# Patient Record
Sex: Female | Born: 1985 | Race: Black or African American | Hispanic: No | Marital: Single | Smoking: Current every day smoker
Health system: Southern US, Community
[De-identification: ages and names within clinical notes are randomized; demographics above are authoritative.]

## PROBLEM LIST (undated history)

## (undated) DIAGNOSIS — Z789 Other specified health status: Secondary | ICD-10-CM

## (undated) DIAGNOSIS — E282 Polycystic ovarian syndrome: Secondary | ICD-10-CM

## (undated) DIAGNOSIS — F32A Depression, unspecified: Secondary | ICD-10-CM

## (undated) HISTORY — DX: Depression, unspecified: F32.A

## (undated) HISTORY — DX: Polycystic ovarian syndrome: E28.2

## (undated) HISTORY — PX: OTHER SURGICAL HISTORY: SHX169

---

## 2005-03-31 ENCOUNTER — Emergency Department (HOSPITAL_COMMUNITY): Admission: EM | Admit: 2005-03-31 | Discharge: 2005-03-31 | Payer: Self-pay | Admitting: Emergency Medicine

## 2009-09-03 ENCOUNTER — Emergency Department (HOSPITAL_COMMUNITY): Admission: EM | Admit: 2009-09-03 | Discharge: 2009-09-03 | Payer: Self-pay | Admitting: Emergency Medicine

## 2010-07-03 ENCOUNTER — Emergency Department (HOSPITAL_COMMUNITY)
Admission: EM | Admit: 2010-07-03 | Discharge: 2010-07-03 | Disposition: A | Discharge status: Home / Self Care | Payer: Self-pay | Attending: Emergency Medicine | Admitting: Emergency Medicine

## 2010-07-03 ENCOUNTER — Emergency Department (HOSPITAL_COMMUNITY): Payer: Self-pay

## 2010-07-03 DIAGNOSIS — M25519 Pain in unspecified shoulder: Secondary | ICD-10-CM | POA: Insufficient documentation

## 2013-03-17 HISTORY — PX: SURGICAL HX OTHER: 99

## 2013-10-24 ENCOUNTER — Encounter (HOSPITAL_COMMUNITY): Payer: Self-pay | Admitting: Emergency Medicine

## 2013-10-24 ENCOUNTER — Emergency Department (HOSPITAL_COMMUNITY): Payer: Medicaid Other

## 2013-10-24 ENCOUNTER — Emergency Department (HOSPITAL_COMMUNITY)
Admission: EM | Admit: 2013-10-24 | Discharge: 2013-10-24 | Disposition: A | Discharge status: Home / Self Care | Payer: Medicaid Other | Attending: Emergency Medicine | Admitting: Emergency Medicine

## 2013-10-24 DIAGNOSIS — M25519 Pain in unspecified shoulder: Secondary | ICD-10-CM | POA: Diagnosis present

## 2013-10-24 DIAGNOSIS — Z87891 Personal history of nicotine dependence: Secondary | ICD-10-CM | POA: Insufficient documentation

## 2013-10-24 DIAGNOSIS — M25511 Pain in right shoulder: Secondary | ICD-10-CM

## 2013-10-24 DIAGNOSIS — R52 Pain, unspecified: Secondary | ICD-10-CM | POA: Insufficient documentation

## 2013-10-24 MED ORDER — CYCLOBENZAPRINE HCL 5 MG PO TABS: 5.0000 mg | ORAL_TABLET | Freq: Three times a day (TID) | ORAL | Status: DC | PRN

## 2013-10-24 MED ORDER — NAPROXEN 500 MG PO TABS: 500.0000 mg | ORAL_TABLET | Freq: Two times a day (BID) | ORAL | Status: DC

## 2013-10-24 MED ORDER — TRAMADOL HCL 50 MG PO TABS: 50.0000 mg | ORAL_TABLET | Freq: Four times a day (QID) | ORAL | Status: DC | PRN

## 2013-10-24 NOTE — ED Notes (Signed)
Pain rt shoulder, onset this am.  No known injury, Tender to palp and pain with motion, Good radial pulse and distal sensation.

## 2013-10-24 NOTE — ED Provider Notes (Signed)
CSN: [id number]     Arrival date & time 10/24/13  1639 History  This chart was scribed for a non-physician practitioner, Debroah Baller, FNP, working with Fredia Sorrow, MD by Cathie Hoops, ED Scribe. The patient was seen in [facility]. The patient's care was started at 5:48 PM.  Chief Complaint  Patient presents with  . Shoulder Pain   Patient is a 28 y.o. female presenting with shoulder pain. The history is provided by the patient. No language interpreter was used.  Shoulder Pain This is a new problem. The current episode started 6 to 12 hours ago. The problem has been gradually worsening. Pertinent negatives include no shortness of breath. She has tried acetaminophen for the symptoms. The treatment provided no relief.   HPI Comments: Jennifer Pollard is a 28 y.o. female who presents to the Emergency Department complaining of new, moderate, gradually worsening right shoulder pain onset 8 hours ago. Pt states she woke up with the pain in the posterior aspect of the right shoulder. Pt attributes her pain to carrying her 60 month old son who weighs 22 lbs. Pt denies any other injury. Pt reports her pain is worsened with right arm movement. Pt reports she carries her son on her right hip. Pt reports she is right-handed.  Pt reports she has taken tylenol with minimal relief. Pt denies fever, chills, nausea or vomiting.  History reviewed. No pertinent past medical history. History reviewed. No pertinent past surgical history. History reviewed. No pertinent family history. History  Substance Use Topics  . Smoking status: Former Research scientist (life sciences)  . Smokeless tobacco: Not on file  . Alcohol Use: No   OB History   Grav Para Term Preterm Abortions TAB SAB Ect Mult Living                 Review of Systems  Constitutional: Negative for fever and chills.  Respiratory: Negative for shortness of breath.   Gastrointestinal: Negative for nausea and vomiting.  Musculoskeletal: Positive for arthralgias (right  shoulder).  Neurological: Negative for weakness.   Allergies  Review of patient's allergies indicates no known allergies.  Home Medications   Prior to Admission medications   Medication Sig Start Date End Date Taking? Authorizing Provider  cyclobenzaprine (FLEXERIL) 5 MG tablet Take 1 tablet (5 mg total) by mouth 3 (three) times daily as needed for muscle spasms. 10/24/13   Hope Bunnie Pion, NP  naproxen (NAPROSYN) 500 MG tablet Take 1 tablet (500 mg total) by mouth 2 (two) times daily. 10/24/13   Hope Bunnie Pion, NP  traMADol (ULTRAM) 50 MG tablet Take 1 tablet (50 mg total) by mouth every 6 (six) hours as needed. 10/24/13   Hope Bunnie Pion, NP   Triage Vitals: BP 126/83  Pulse 86  Temp(Src) 98.8 F (37.1 C) (Oral)  Resp 16  Ht 5\' 4"  (1.626 m)  Wt 150 lb (68.04 kg)  BMI 25.73 kg/m2  SpO2 100% Physical Exam  Nursing note and vitals reviewed. Constitutional: She is oriented to person, place, and time. She appears well-developed and well-nourished.  HENT:  Head: Normocephalic.  Eyes: EOM are normal.  Neck: Neck supple.  Cardiovascular: Normal rate.   Pulmonary/Chest: Effort normal.  Musculoskeletal: Normal range of motion.  Right Shoulder: Pain with ROM. Normal reflexes. Strong radial pulses, good grips and equal.   Neurological: She is alert and oriented to person, place, and time. No cranial nerve deficit.  Skin: Skin is warm and dry.  Psychiatric: She has a normal mood and  affect. Her behavior is normal.    ED Course  Procedures (including critical care time) DIAGNOSTIC STUDIES: Oxygen Saturation is 100% on RA, normal by my interpretation.    COORDINATION OF CARE: 5:54 PM- Patient informed of current plan for treatment and evaluation and agrees with plan at this time.  Imaging Review  Dg Shoulder Right 10/24/2013   CLINICAL DATA:  28 year old female with right shoulder pain. No known injury.  EXAM: RIGHT SHOULDER - 2+ VIEW  COMPARISON:  None.  FINDINGS: There is no evidence of  fracture or dislocation.  Slight low position of the humeral head may represent a joint effusion.  No focal bony abnormalities are noted.  The visualized right bony thorax is unremarkable.  IMPRESSION: Question shoulder effusion.  No bony abnormalities.   Electronically Signed   By: Hassan Rowan M.D.   On: 10/24/2013 17:06    MDM  28 y.o. female with right shoulder pain without known injury. Stable for discharge without neurovascular deficits. Arm sling, ice, rest and follow up with ortho. Discussed with the patient and all questioned fully answered. She will return if any problems arise.    Medication List         cyclobenzaprine 5 MG tablet  Commonly known as:  FLEXERIL  Take 1 tablet (5 mg total) by mouth 3 (three) times daily as needed for muscle spasms.     naproxen 500 MG tablet  Commonly known as:  NAPROSYN  Take 1 tablet (500 mg total) by mouth 2 (two) times daily.     traMADol 50 MG tablet  Commonly known as:  ULTRAM  Take 1 tablet (50 mg total) by mouth every 6 (six) hours as needed.         I personally performed the services described in this documentation, which was scribed in my presence. The recorded information has been reviewed and is accurate.     [place], [place] 10/27/13 [phone]

## 2013-10-24 NOTE — ED Notes (Signed)
Pt woke up with right shoulder pain, denies any pain, limited ROM

## 2013-10-31 NOTE — ED Provider Notes (Signed)
Medical screening examination/treatment/procedure(s) were performed by non-physician practitioner and as supervising physician I was immediately available for consultation/collaboration.   EKG Interpretation None       Fredia Sorrow, MD 10/31/13 [phone]

## 2014-08-29 ENCOUNTER — Emergency Department (HOSPITAL_COMMUNITY)
Admission: EM | Admit: 2014-08-29 | Discharge: 2014-08-29 | Disposition: A | Discharge status: Home / Self Care | Payer: Medicaid Other | Attending: Emergency Medicine | Admitting: Emergency Medicine

## 2014-08-29 ENCOUNTER — Emergency Department (HOSPITAL_COMMUNITY): Payer: Medicaid Other

## 2014-08-29 ENCOUNTER — Encounter (HOSPITAL_COMMUNITY): Payer: Self-pay | Admitting: Emergency Medicine

## 2014-08-29 DIAGNOSIS — Z720 Tobacco use: Secondary | ICD-10-CM | POA: Insufficient documentation

## 2014-08-29 DIAGNOSIS — Z3202 Encounter for pregnancy test, result negative: Secondary | ICD-10-CM | POA: Diagnosis not present

## 2014-08-29 DIAGNOSIS — M79672 Pain in left foot: Secondary | ICD-10-CM

## 2014-08-29 DIAGNOSIS — M79671 Pain in right foot: Secondary | ICD-10-CM | POA: Diagnosis not present

## 2014-08-29 LAB — POC URINE PREG, ED: PREG TEST UR: NEGATIVE

## 2014-08-29 MED ORDER — IBUPROFEN 800 MG PO TABS: 800.0000 mg | ORAL_TABLET | Freq: Three times a day (TID) | ORAL | Status: DC

## 2014-08-29 MED ORDER — TRAMADOL HCL 50 MG PO TABS: ORAL_TABLET | ORAL | Status: AC

## 2014-08-29 MED ORDER — IBUPROFEN 800 MG PO TABS
800.0000 mg | ORAL_TABLET | Freq: Once | ORAL | Status: AC
  Administered 2014-08-29: 800 mg via ORAL
  Filled 2014-08-29: qty 1

## 2014-08-29 MED ORDER — KETOROLAC TROMETHAMINE 10 MG PO TABS: 10.0000 mg | ORAL_TABLET | Freq: Three times a day (TID) | ORAL | Status: DC

## 2014-08-29 MED ORDER — ACETAMINOPHEN 325 MG PO TABS
650.0000 mg | ORAL_TABLET | Freq: Once | ORAL | Status: AC
  Administered 2014-08-29: 650 mg via ORAL
  Filled 2014-08-29: qty 2

## 2014-08-29 NOTE — ED Notes (Signed)
Urine pregnancy negative  

## 2014-08-29 NOTE — ED Notes (Signed)
Pt reports right foot pain since yesterday. Pt denies any known injury. nad noted.

## 2014-08-29 NOTE — Discharge Instructions (Signed)
Your xray is negative for acute problem. Please elevate your foot and use the post operative shoe for the next 5 days. Use ibuprofen three times daily with food. Use ultram for pain if needed. Use this medication with caution, it may cause drowsiness. Please see Dr. Aline Brochure for additional evaluation of not improving.

## 2014-08-29 NOTE — ED Provider Notes (Signed)
CSN: [id number]     Arrival date & time 08/29/14  1504 History   First MD Initiated Contact with Patient 08/29/14 1549     Chief Complaint  Patient presents with  . Foot Pain     (Consider location/radiation/quality/duration/timing/severity/associated sxs/prior Treatment) Patient is a 29 y.o. female presenting with lower extremity pain. The history is provided by the patient.  Foot Pain This is a new problem. The current episode started yesterday. The problem occurs intermittently. The problem has been gradually worsening. Associated symptoms include arthralgias. Pertinent negatives include no abdominal pain, chest pain, coughing or neck pain. The symptoms are aggravated by standing and walking. She has tried nothing for the symptoms. The treatment provided moderate relief.    History reviewed. No pertinent past medical history. History reviewed. No pertinent past surgical history. History reviewed. No pertinent family history. History  Substance Use Topics  . Smoking status: Current Every Day Smoker -- 0.50 packs/day  . Smokeless tobacco: Not on file  . Alcohol Use: No   OB History    No data available     Review of Systems  Constitutional: Negative for activity change.       All ROS Neg except as noted in HPI  HENT: Negative for nosebleeds.   Eyes: Negative for photophobia and discharge.  Respiratory: Negative for cough, shortness of breath and wheezing.   Cardiovascular: Negative for chest pain and palpitations.  Gastrointestinal: Negative for abdominal pain and blood in stool.  Genitourinary: Negative for dysuria, frequency and hematuria.  Musculoskeletal: Positive for arthralgias. Negative for back pain and neck pain.  Skin: Negative.   Neurological: Negative for dizziness, seizures and speech difficulty.  Psychiatric/Behavioral: Negative for hallucinations and confusion.      Allergies  Review of patient's allergies indicates no known allergies.  Home Medications    Prior to Admission medications   Medication Sig Start Date End Date Taking? Authorizing Provider  acetaminophen (TYLENOL) 500 MG tablet Take 1,000 mg by mouth every 6 (six) hours as needed for moderate pain.   Yes Historical Provider, MD   BP 122/83 mmHg  Pulse 87  Temp(Src) 98.2 F (36.8 C) (Oral)  Resp 14  Ht 5\' 4"  (1.626 m)  Wt 160 lb (72.576 kg)  BMI 27.45 kg/m2  SpO2 97%  LMP 08/29/2014 Physical Exam  Constitutional: She is oriented to person, place, and time. She appears well-developed and well-nourished.  Non-toxic appearance.  HENT:  Head: Normocephalic.  Right Ear: Tympanic membrane and external ear normal.  Left Ear: Tympanic membrane and external ear normal.  Eyes: EOM and lids are normal. Pupils are equal, round, and reactive to light.  Neck: Normal range of motion. Neck supple. Carotid bruit is not present.  Cardiovascular: Normal rate, regular rhythm, normal heart sounds, intact distal pulses and normal pulses.   Pulmonary/Chest: Breath sounds normal. No respiratory distress.  Abdominal: Soft. Bowel sounds are normal. There is no tenderness. There is no guarding.  Musculoskeletal: Normal range of motion.       Right foot: There is tenderness. There is normal capillary refill and no deformity.       Feet:  Lymphadenopathy:       Head (right side): No submandibular adenopathy present.       Head (left side): No submandibular adenopathy present.    She has no cervical adenopathy.  Neurological: She is alert and oriented to person, place, and time. She has normal strength. No cranial nerve deficit or sensory deficit.  Skin: Skin is  warm and dry.  Psychiatric: She has a normal mood and affect. Her speech is normal.  Nursing note and vitals reviewed.   ED Course  Procedures (including critical care time) Labs Review Labs Reviewed  POC URINE PREG, ED    Imaging Review No results found.   EKG Interpretation None      MDM  Vital signs stable. Neg  Homan's signs noted. No evidence of infection involving the right foot or ankle. Xray is negative for fx or dislocation, or fb. Suspect tendonitis or related problem. Pt fitted with ace bandage and post op shoe and crutches. Pt to follow up with Dr Aline Brochure if not improving on ibuprofen and tramadol.   Final diagnoses:  None    **I have reviewed nursing notes, vital signs, and all appropriate lab and imaging results for this patient.*    Lily Kocher, PA-C 08/29/14 2053  [place], DO 08/29/14 2118

## 2015-07-25 ENCOUNTER — Encounter (HOSPITAL_COMMUNITY): Payer: Self-pay | Admitting: Emergency Medicine

## 2015-07-25 ENCOUNTER — Emergency Department (HOSPITAL_COMMUNITY)
Admission: EM | Admit: 2015-07-25 | Discharge: 2015-07-25 | Disposition: A | Discharge status: Home / Self Care | Payer: Medicaid Other | Attending: Emergency Medicine | Admitting: Emergency Medicine

## 2015-07-25 DIAGNOSIS — F17200 Nicotine dependence, unspecified, uncomplicated: Secondary | ICD-10-CM | POA: Insufficient documentation

## 2015-07-25 DIAGNOSIS — K0889 Other specified disorders of teeth and supporting structures: Secondary | ICD-10-CM | POA: Diagnosis present

## 2015-07-25 MED ORDER — AMOXICILLIN 250 MG PO CAPS
500.0000 mg | ORAL_CAPSULE | Freq: Once | ORAL | Status: AC
  Administered 2015-07-25: 500 mg via ORAL
  Filled 2015-07-25: qty 2

## 2015-07-25 MED ORDER — DICLOFENAC SODIUM 50 MG PO TBEC: 50.0000 mg | DELAYED_RELEASE_TABLET | Freq: Two times a day (BID) | ORAL | Status: AC

## 2015-07-25 MED ORDER — AMOXICILLIN 500 MG PO CAPS: 500.0000 mg | ORAL_CAPSULE | Freq: Three times a day (TID) | ORAL | Status: DC

## 2015-07-25 NOTE — ED Notes (Signed)
Onset yesterday, lower pain on left, pt feels like she has a wisdom tooth coming in.

## 2015-07-25 NOTE — Discharge Instructions (Signed)

## 2015-07-25 NOTE — ED Provider Notes (Signed)
CSN: [id number]     Arrival date & time 07/25/15  2144 History  By signing my name below, I, Randa Evens, attest that this documentation has been prepared under the direction and in the presence of Fransico Meadow, PA-C. Electronically Signed: Randa Evens, ED Scribe. 07/25/2015. 10:28 PM.      Chief Complaint  Patient presents with  . Dental Pain   The history is provided by the patient. No language interpreter was used.   HPI Comments: KELE HRABOVSKY is a 30 y.o. female who presents to the Emergency Department complaining of left sided dental pain onset 1 day prior. Pt states that one of her molars recently cracked. Pt deosnt report any medications PTA. Pt denies fever, nausea, vomiting or trouble swallowing.   History reviewed. No pertinent past medical history. History reviewed. No pertinent past surgical history. No family history on file. Social History  Substance Use Topics  . Smoking status: Current Some Day Smoker -- 0.50 packs/day  . Smokeless tobacco: None  . Alcohol Use: No   OB History    No data available      Review of Systems  Constitutional: Negative for fever and chills.  HENT: Positive for dental problem. Negative for trouble swallowing.   Gastrointestinal: Negative for nausea and vomiting.  All other systems reviewed and are negative.    Allergies  Review of patient's allergies indicates no known allergies.  Home Medications   Prior to Admission medications   Medication Sig Start Date End Date Taking? Authorizing Provider  acetaminophen (TYLENOL) 500 MG tablet Take 1,000 mg by mouth every 6 (six) hours as needed for moderate pain.    Historical Provider, MD  ibuprofen (ADVIL,MOTRIN) 800 MG tablet Take 1 tablet (800 mg total) by mouth 3 (three) times daily. 08/29/14   Lily Kocher, PA-C  ibuprofen (ADVIL,MOTRIN) 800 MG tablet Take 1 tablet (800 mg total) by mouth 3 (three) times daily. 08/29/14   Lily Kocher, PA-C  traMADol (ULTRAM) 50 MG tablet  1 or 2 po q6h prn pain 08/29/14   Lily Kocher, PA-C   BP 129/89 mmHg  Pulse 80  Temp(Src) 98.8 F (37.1 C) (Oral)  Resp 18  Ht 5\' 4"  (1.626 m)  Wt 145 lb (65.772 kg)  BMI 24.88 kg/m2  SpO2 100%  LMP 07/24/2015   Physical Exam  Constitutional: She is oriented to person, place, and time. She appears well-developed and well-nourished. No distress.  HENT:  Head: Normocephalic and atraumatic.  Brokne left 3rd molar.   Eyes: Conjunctivae and EOM are normal.  Neck: Neck supple. No tracheal deviation present.  Cardiovascular: Normal rate.   Pulmonary/Chest: Effort normal. No respiratory distress.  Musculoskeletal: Normal range of motion.  Neurological: She is alert and oriented to person, place, and time.  Skin: Skin is warm and dry.  Psychiatric: She has a normal mood and affect. Her behavior is normal.  Nursing note and vitals reviewed.   ED Course  Procedures (including critical care time)\ DIAGNOSTIC STUDIES: Oxygen Saturation is 100% on RA, normal by my interpretation.    COORDINATION OF CARE: 10:27 PM-Discussed treatment plan with pt at bedside and pt agreed to plan.     Labs Review Labs Reviewed - No data to display  Imaging Review No results found.    EKG Interpretation None      MDM Pt has been seen at dental clinic.  Pt advise to see dentist for evaluation.   Final diagnoses:  Toothache    Meds ordered  this encounter  Medications  . amoxicillin (AMOXIL) 500 MG capsule    Sig: Take 1 capsule (500 mg total) by mouth 3 (three) times daily.    Dispense:  30 capsule    Refill:  0    Order Specific Question:  Supervising Provider    Answer:  MILLER, BRIAN [[id number]]  . diclofenac (VOLTAREN) 50 MG EC tablet    Sig: Take 1 tablet (50 mg total) by mouth 2 (two) times daily.    Dispense:  20 tablet    Refill:  0    Order Specific Question:  Supervising Provider    Answer:  Noemi Chapel [[id number]]  An After Visit Summary was printed and given to the  patient.     [place], PA-C 07/25/15 2231

## 2015-10-03 NOTE — ED Provider Notes (Signed)
Medical screening examination/treatment/procedure(s) were performed by non-physician practitioner and as supervising physician I was immediately available for consultation/collaboration.   EKG Interpretation None       Isla Pence, MD 10/03/15 1559

## 2015-11-29 ENCOUNTER — Encounter (HOSPITAL_BASED_OUTPATIENT_CLINIC_OR_DEPARTMENT_OTHER): Payer: Self-pay | Admitting: Neurology

## 2015-11-29 NOTE — Progress Notes (Signed)
NEUROLOGY HEADACHE CLINIC NOTE       ASSESSMENT AND PLAN (Please see below for documentation of history and exam)  Erica [name]  is a 30 year old female with diagnosis of (G43.701) Chronic migraine without aura with status migrainosus, not intractable  (primary encounter diagnosis).  Currently has near-daily mild headache, with migraine about once a week, has had up to 3 days or more per week of migraine in the past.  Just started Botox prior to her move to [facility], and seeing some improvement with that, appropriate to continue, depending on her insurance plan may need to be done at [facility].  She is doing a great job with lifestyle management including exercise and diet.  May be treating her migraines a little too late for sumatriptan to be optimally effective.  Recommendations discussed as follows:    Try taking 1/2 tab sumatriptan a little earlier in course of migraine    Consider Cefaly device - www.cefaly.[place]    Magnesium Citrate or Glycinate 250 mg daily, slowly increase up to 500 mg twice daily. Side effect may include diarrhea, so we recommend stopping at whatever dose is comfortable for your body without causing this side effect.    Feverfew Tea 1-3 cups daily for both migraine prevention and/or acute treatment. We recommend the tea over the capsule as the capsule variety may not have the active ingredient. This is a natural inflammatory herb. Can get tea bags from [employer] (Buddha Teas, feverfew with lemongrass, more tolerable flavor) or loose leaf from [employer] (the natural pharmacy at [facility]).    Contact me when you know if you will be doing Botox with [place] or with Pascal Lux written for all prescriptions since she is establishing care in [place] area.  Follow-up to be determined, depending on needs and insurance plan.      --------------------------------------------------------------------------------------    CHIEF COMPLAINT   Uncontrolled headache    Referring physician: Self  Referred    Primary care provider: No primary care provider on file.      HISTORY OF PRESENT ILLNESS:  Tanyais a very pleasant60year oldFemaleself-referredfor consultation and evaluation of difficult to treat headache. First headache(s) occured whenTanyawas72years old.  Headaches started to become a problem about20yearsago.Tanyais having an average of20days of headache per month, with around5days of severe headache per month. The average pain severity of these headaches is7out of 10 with the most severe headaches rated as9out of 10. The headaches will often last8hours, occurringmorning.  Tanyabelieves that the headache(s) is caused by "Genetics and stress."Tanyahas the following description of their headache: "I have had migraines since I was 6 and they have gotten worse in the past few years.".    Started age 10, more severe over time.  Used to take Excedrin PRN until about 4-5 years ago, started seeing neurologist and trying preventive meds.  Currently having migraine about once a week, with mild pain in same area of her head most days, not 24 hours a day but most of the day, usually able to ignore it, feels this is mostly stress-related.     GOAL(S) OF THE VISIT IN THE NEUROLOGY HEADACHE CLINIC.  Tanyahas the following goals for this visit:   Discuss headache management   Tanyawould like to ask a specific treatment   TREATMENT PREFERENCE(S).  Tanyaprefers the following treatment options:   Acute prescription medication   BOTOX   Biofeedback or meditation   Hypnosis   Stress management   Tanyais interested in learning about new  medications and other new treatments.   TREATMENT MODALITIES.  Tanyais willing to participate in the following treatment modalities:   Change current medication(s)   Exercise   Change diet   CONCERNS REGARDING CHANGES IN HEADACHE AND RELATED SYMPTOMS.  Tanyais concerned that the headache have changed in the following qualities:   The headaches have  increased in their severity   The headaches are more intense   HEADACHE LOCATION:   Forehead   Back of the head/ occipital area(s)   Neck   LATERALITY OF HEADACHE:   Right side   QUALITY OF HEADACHE PAIN   Pulsating/throbbing   Pressure   HEADACHE TRIGGERS.  Tanyahas the following headache triggers:   Stress - biggest trigger   Menstrual period - now has Mirena IUD   Change in sleep   Skipped meals, thirst or dehydration   Alcohol (for example, red wine)   Exercise and physical exertion - hot weather   Change in weather   Environment over stimulation: glare, odors and other      ASSOCIATED HEADACHE SYMPTOMS.   Nausea/vomiting - nausea typical, vomiting rarely   Light sensitivity   Sound sensitivity   Fatigue/ low energy   Neck stiffness or tenderness   AUTONOMIC SYMPTOMS   No autonomic symptoms during headache   EXACERBATION OF HEADACHES.  Tanyafeels that the headaches are worse with:   Bright light or glare   Loud sounds   Strong smells   Physical exertion   Mental exertion   Being at work   [employer] work      HEADACHE RELIEF.  Tanyafinds the following to be helpful in relieving headaches:   Medications   Avoiding dehydration   Rest   Avoiding bright lights, loud sounds, certain smells   Staying at home and not working        MEDICATIONS TRIED   ACUTE MEDICATIONS   Acetaminophen (tylenol)   Ibuprofen (Motrin/Advil) - occasional for a mild HA   Naproxen (Aleve/Naprosyn)   Ketorolac (Toradol/Sprix) - has [facility] shots at [facility]   Excedrin   Imitrex (Sumatriptan) - takes together with naproxen, about once a week now, effective for pain but still has migraine hangover lasting about 24 hours with a lot of fatigue, depression   Maxalt (Rizatriptan) - tried a long time ago, had a hard time deciding when to take it   Marijuana - PRN for migraine   PREVENTIVE MEDICATIONS FOR HEADACHE   Inderal (propranolol) - not helpful   Topamax (topiramate) - took for 2 years, a lot of issues with word-finding, somewhat helpful  but stopped due to side effects even at low dose   Elavil (Amitriptyline) - caused rapid weight gain   Lexapro - for depression in the past   Venlafaxine (Effexor) - for both mood and migraine, helpful for depression, not clearly for migraine  Botox - first treatment June 12, helpful      THERAPIES TRIED   Massage Therapy - helpful for prolonged migraine   Acupuncture - reduced severity but not worth the cost   Biofeedback - has also worked for neurofeedback clinic   Meditation   Diet  No alcohol or chocolate   Exercise  Yoga, running 3x per week - helpful with an exercise routine but has to moderate intensity   SUPPLEMENTS   Magnesium - not helpful at 500 mg, maybe increased depression   Feverfew - not helpful   Butterbur - not helpful   Melatonin - taking  for sleep and migraine   Marijuana - indica/CBD   NEUROIMAGING/ED:   Tanyareports they have never had CT head.   Tanyareports they have had MRI of the brain on09-13-2016. Results of the scan perTanyaare:Normal.     Tanyareports they have never sought care in the ER for headache.     SOCIAL HISTORY:  In general,Tanyadescribes their overall health [id number] good. In the past 3 months, headaches have interfered with their normal work (outside and housework):Quite a bit.TanyahasDoctorate.   Currently works inTeaching. On average, they work about40per week.   On average,Tanyamisses about5days of work per month.     Minutes per week of moderate to vigorous exercise:60  Servings of carbohydrates per day:2  Servings of vegetables and fruit per day:3   Tanyadoes not smoke.   Tanyastates that they drink7cups of caffeinated beverages per week.   Tanyastates that they drink3alcoholic beverages per week.   Tanyadoes not endorse perceived difficulty sleeping.     ADDITIONAL DATA:  PHQ-4 score is4  PSS score is18     Review of Symptoms, reviewed uploaded Mikes include:   Headache,   Light-headedness,   Difficulty speaking,    Depressed mood           PAST MEDICAL HISTORY  Past Medical History:   Diagnosis Date   . Depression    . PCOS (polycystic ovarian syndrome)        PAST SURGICAL HISTORY  Past Surgical History:   Procedure Laterality Date   . ANES; REMOVAL OF OVARIAN CYST(S)     . TONSILLECTOMY ONE-HALF <AGE 58         FAMILY HISTORY  Family History   Problem Relation Age of Onset   . Migraine Headaches Mother          CURRENT MEDICATIONS     Outpatient Prescriptions Marked as Taking for the 12/03/15 encounter (Office Visit) with Eilene Ghazi, MD   Medication Sig Dispense Refill   . Melatonin 5 MG Oral Tab Take by mouth at bedtime.     . Naproxen 500 MG Oral Tab Take 550 mg by mouth one time a week.     Marland Kitchen ONDANSETRON HCL OR      . SUMAtriptan Succinate 100 MG Oral Tab Take 100 mg by mouth one time a week.     . Venlafaxine HCl ER 75 MG Oral CAPSULE SR 24 HR Take 75 mg by mouth daily.  3       ALLERGIES  Amoxicillin and Pecan pollen allergy skin test    REVIEW OF SYSTEMS - Per uploaded patient survey which I have reviewed.    PHYSICAL EXAMINATION   VITAL SIGNS: There were no vitals taken for this visit.   GENERAL:  Neatly groomed, cooperative, in no acute distress.  CARDIOVASCULAR:  Regular rate and rhythm, extremities warm and well-perfused.   NEUROLOGIC:    Mental status:  Alert and oriented with normal attention, concentration, language, recent and remote memory.  Cranial nerves:  Optic discs appear normal, pupils equal and reactive to light, extraocular eye movements intact, facial sensation intact to light touch, face symmetric with full facial movements, hearing intact to conversation, palate elevates symmetrically, tongue protrudes midline, sternocleidomastoid and trapezius are 5/5 bilaterally.  Motor: Bulk and tone are normal.  Strength is 5/5 throughout in both upper and lower extremities.  Reflexes: 2+ and symmetric throughout in both upper and lower extremities.    Sensation: Intact to light touch in both upper and  lower extremities.      Coordination: Finger-to-nose intact bilaterally.  Gait: Normal gait, able to walk on heels and toes and in tandem.   Movements: No tremor or abnormal movements observed.  Musculoskeletal: Normal range of motion in the neck.

## 2015-12-03 ENCOUNTER — Ambulatory Visit: Payer: 59 | Attending: Neurology | Admitting: Neurology

## 2015-12-03 ENCOUNTER — Encounter (HOSPITAL_BASED_OUTPATIENT_CLINIC_OR_DEPARTMENT_OTHER): Payer: Self-pay | Admitting: Neurology

## 2015-12-03 VITALS — BP 118/74 | HR 80 | Ht 61.0 in | Wt 130.0 lb

## 2015-12-03 DIAGNOSIS — Z6824 Body mass index (BMI) 24.0-24.9, adult: Secondary | ICD-10-CM

## 2015-12-03 DIAGNOSIS — G43701 Chronic migraine without aura, not intractable, with status migrainosus: Secondary | ICD-10-CM | POA: Insufficient documentation

## 2015-12-03 MED ORDER — ONDANSETRON HCL 8 MG OR TABS
8.0000 mg | ORAL_TABLET | Freq: Three times a day (TID) | ORAL | 11 refills | Status: AC | PRN
Start: 2015-12-03 — End: ?

## 2015-12-03 MED ORDER — VENLAFAXINE HCL ER 75 MG OR CP24
75.0000 mg | EXTENDED_RELEASE_CAPSULE | Freq: Every day | ORAL | 11 refills | Status: DC
Start: 2015-12-03 — End: 2016-06-26

## 2015-12-03 MED ORDER — SUMATRIPTAN SUCCINATE 100 MG OR TABS
100.0000 mg | ORAL_TABLET | ORAL | 11 refills | Status: DC | PRN
Start: 2015-12-03 — End: 2017-08-07

## 2015-12-03 NOTE — Patient Instructions (Signed)
Try taking 1/2 tab sumatriptan a little earlier in course of migraine    Consider Cefaly device - www.cefaly.[place]    Magnesium Citrate or Glycinate 250 mg daily, slowly increase up to 500 mg twice daily. Side effect may include diarrhea, so we recommend stopping at whatever dose is comfortable for your body without causing this side effect.    Feverfew Tea 1-3 cups daily for both migraine prevention and/or acute treatment. We recommend the tea over the capsule as the capsule variety may not have the active ingredient. This is a natural inflammatory herb. Can get tea bags from [employer] (Buddha Teas, feverfew with lemongrass, more tolerable flavor) or loose leaf from [employer] (the natural pharmacy at [facility]).    Contact me when you know if you will be doing Botox with [place] or with [facility]

## 2015-12-10 ENCOUNTER — Encounter (HOSPITAL_BASED_OUTPATIENT_CLINIC_OR_DEPARTMENT_OTHER): Payer: Self-pay | Admitting: Neurology

## 2015-12-10 NOTE — Telephone Encounter (Signed)
Refer to Amy Dechene, MA.

## 2015-12-12 NOTE — Telephone Encounter (Signed)
Caller: Erica [name]  Relationship: Self  Phone: [phone]       Reason for call: Makita called to schedule a botox injection appointment. She received botox injections at her previous neurologist office. She needs a prior authorization for botox started. She is past her due date for her next injections, and would like an injection ASAP. Please call back when done.

## 2015-12-14 ENCOUNTER — Telehealth (HOSPITAL_BASED_OUTPATIENT_CLINIC_OR_DEPARTMENT_OTHER): Payer: Self-pay

## 2015-12-14 NOTE — Telephone Encounter (Signed)
LVM informing pt I will request orders from Dr Geanie Kenning for the Botox Injections.    I have forwarded the message to Dr. Geanie Kenning.    Waiting for orders for Botox Injections.  Once received will send referral to [employer].

## 2015-12-25 ENCOUNTER — Ambulatory Visit: Payer: Commercial Managed Care - PPO | Attending: Family | Admitting: Family

## 2015-12-25 ENCOUNTER — Encounter (HOSPITAL_BASED_OUTPATIENT_CLINIC_OR_DEPARTMENT_OTHER): Payer: Self-pay | Admitting: Family

## 2015-12-25 VITALS — BP 107/71 | HR 89 | Ht 61.0 in | Wt 135.2 lb

## 2015-12-25 DIAGNOSIS — G43701 Chronic migraine without aura, not intractable, with status migrainosus: Secondary | ICD-10-CM | POA: Insufficient documentation

## 2015-12-25 MED ORDER — ONABOTULINUMTOXINA 100 UNITS IJ SOLR
100.0000 [IU] | Freq: Once | INTRAMUSCULAR | Status: AC
Start: 2015-12-25 — End: 2015-12-25
  Administered 2015-12-25: 200 [IU]

## 2015-12-25 NOTE — Progress Notes (Signed)
[facility] PROCEDURE NOTE    Chief Complaint   Patient presents with   . Migraine     IDENTIFICATION DATA  [name],  first presented to the clinic on 12/03/15, evaluated by Dr. Geanie Kenning  [name],is a pleasant 30 year old female  With diagnosis of  (G43.701) Chronic migraine without aura with status migrainosus, not intractable  (primary encounter diagnosis).  Currently has near-daily mild headache, with migraine about once a week, has had up to 3 [name] or more per week of migraine in the past.  Just started Botox prior to her move to [facility], and seeing some improvement with thatShe is doing a great job with lifestyle management including exercise and diet.  May be treating her migraines a little too late for sumatriptan to be optimally effective.    INTERVAL HISTORY  I am seeing Erica [name] for the first time today. She is here for Botox  injection #1 at this clinic for medical necessity for chronic migraines. We received PA for 4 treatments, expiry in October 2018. She had botox once in [place].  Headaches 20 [name] a month with 5-8 severe headache [name]  More on the right side  Light and sound sensitivity, nausea, muscle tension in neck and shoulders  Massage therapy helpful    ASSESSMENT    (G43.701) Chronic migraine without aura with status migrainosus, not intractable  (primary encounter diagnosis)    PROCEDURE:  [id number] - Botulinum Toxin A - Botox 200U   [id number] - Chemodenervation of facial/trigeminal/cerv msc and accessory nerves bilaterally (migraines)  200U of Botox was given (155U injected + 45U wasted).     The risk and benefits of the procedure were explained and she agreed to have the procedure performed. Written consent form signed.     Injection Summary   The patient  received 155U of BOTOX in 31 injections. The following muscles were injected:   Amount Muscle   5 U Procerus   5 U Corrugator Supercilii (left)   5 U Corrugator Supercilii (right)   10 U Epicranius -  Occipitofrontalis Venter Frontalis (right)   10 U Epicranius - Occipitofrontalis Venter Frontalis (left)   20 U Temporalis (right)   20 U Temporalis (left)   15 U Venter Occipitalis (right)   15 U Venter Occipitalis (left)   10 U Semispinalis capitis (left)   10 U Semispinalis capitis (right)   15 U Trapezius (right)  15 U Trapezius (left)   45 units wasted     The patient  tolerated the BOTOX  well with no side effects.     PLAN:   1.  Follow up in 3 months for Botox injections,  sooner if concerns.   2. Current PA expires in October 2018.   3. Referral to massage therapy

## 2015-12-25 NOTE — Patient Instructions (Signed)
1.  Follow up in 3 months for Botox injections,  sooner if concerns.   2. Referral to massage therapy

## 2016-01-11 ENCOUNTER — Ambulatory Visit (HOSPITAL_BASED_OUTPATIENT_CLINIC_OR_DEPARTMENT_OTHER): Payer: Commercial Managed Care - PPO | Attending: Obstetrics & Gynecology | Admitting: Gynecologic Oncology

## 2016-01-11 ENCOUNTER — Ambulatory Visit: Payer: Commercial Managed Care - PPO

## 2016-01-11 VITALS — BP 113/75 | HR 98 | Temp 98.4°F

## 2016-01-11 DIAGNOSIS — E282 Polycystic ovarian syndrome: Secondary | ICD-10-CM

## 2016-01-11 DIAGNOSIS — R1031 Right lower quadrant pain: Secondary | ICD-10-CM

## 2016-01-11 DIAGNOSIS — Z8742 Personal history of other diseases of the female genital tract: Secondary | ICD-10-CM | POA: Insufficient documentation

## 2016-01-11 DIAGNOSIS — Z9889 Other specified postprocedural states: Secondary | ICD-10-CM | POA: Insufficient documentation

## 2016-01-11 LAB — PR URINE PREGNANCY TEST HCG, ONSITE: Pregnancy (HCG) (UWNC), URN: NEGATIVE

## 2016-01-11 NOTE — Progress Notes (Deleted)
[place]  NEW GYNECOLOGY VISIT    ID/CC: 30 year old No obstetric history on file. female presents for a new gynecology visit for   Chief Complaint   Patient presents with   . Abdominal Pain     right side she does have a hx of ovarian cysts       HPI: Had an 8cm cyst on the right ovary; had a laparoscopic cystectomy. Was told that it was benign but does not recall the type of cyst; remember that was not a teratoma.    Now having pain for about 2 weeks. Also has been noticing some breast enlargement which also happened for her the last time. Has been having nausea but this is not new.  The pain is dull and persistent in the RLQ. A few days ago felt more 'stabby,' considered going to the ER. Nothing seems to bring it on; ibuprofen makes it go away. Has not had any vaginal bleeding. Last time she had any bleeding was 6 weeks ago. No nausea/vomiting associated with the pain.    Sexually active. Has not had sex for about 8 weeks. Boyfriend is back in [place].  IUD has been in place for 3 months - replaced. This is her 2nd.     Has rare bleeding with the Mirena. Had the intial one placed in 2012.    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    Had a Pap that was positive for HPV, then had a colposcopy - which was negative. Most recent Pap smear was 18 months ago and was normal.    Had been on metformin briefly for PCOS    Has unwanted chin hair as well as on the abdomen and lower back. Before Mirena IUD had irregular bleeding, and was diagnosed with PCOS. Now on the Mirena IUD has been almost amenorrheic.    OB History     None              Current Outpatient Prescriptions   Medication Sig Dispense Refill   . Melatonin 5 MG Oral Tab Take by mouth at bedtime.     . Naproxen 500 MG Oral Tab Take 550 mg by mouth one time a week.     . Ondansetron HCl 8 MG Oral Tab Take 1 tablet (8 mg) by mouth every 8 hours as needed for nausea/vomiting. 12 tablet 11   . SUMAtriptan  Succinate 100 MG Oral Tab Take 1 tablet (100 mg) by mouth every 2 hours as needed for migraines. Take at onset of migraine. May repeat x 1 dose. Max 200 mg/24 hours 9 tablet 11   . Venlafaxine HCl ER 75 MG Oral CAPSULE SR 24 HR Take 1 capsule (75 mg) by mouth daily. 30 capsule 11     No current facility-administered medications for this visit.        Review of patient's allergies indicates:  Allergies   Allergen Reactions   . Amoxicillin Hives   . Pecan Pollen Allergy Skin Test        Patient Active Problem List    Diagnosis Date Noted   . Chronic migraine without aura with status migrainosus, not intractable [G43.701] 12/03/2015       Past Surgical History:   Procedure Laterality Date   . ANES; REMOVAL OF OVARIAN CYST(S)     . TONSILLECTOMY ONE-HALF <AGE 65         Family History     Problem (#  of Occurrences) Relation (Name,Age of Onset)    Migraine Headaches (1) Mother          Social History   Substance Use Topics   . Smoking status: Never Smoker   . Smokeless tobacco: Never Used   . Alcohol use Yes      Comment: 3 per week         There is no immunization history on file for this patient.    ROS:  Extended 2-9, Complete 10+   Constitutional: Negative    Eyes: Negative    Ears, Nose, Mouth, Throat: Negative    Cardiovascular: Negative    Respiratory: Negative    Gastrointestinal: Negative   Genitourinary: Negative   Musculoskeletal: Negative    Neurological: Negative    Psychiatric: Negative       Physical Exam:  1995   Detailed - 5-7 systems, Comprehensive -8+  BP 113/75   Pulse 98   Temp 98.4 F (36.9 C) (Temporal)   General: healthy, alert, no distress.  Neck: Supple, no adenopathy, thyroid symmetric, normal size, without nodules.  Breasts:  Inspection:  Normal breast symmetry,  normal nipples without discharge.No skin lesions. Palpation: Normal breast tissue bilaterally, no tenderness, no suspicious masses. Axillary nodes: No adenopathy.  Cardiac: Normal pulses in the lower extremities with normal  capillary refill, No edema or varicosities.  Respiratory: Normal respiratory effort and chest wall movement with respiration.   Abdomen: Soft, non-tender. BS normal. No masses or organomegaly.   Psychiatric:   Mood/affect:  Normal.  Orientation: oriented to time, person and place  Neurologic:  Gait:  Normal.  Skin: Skin color, texture, turgor normal. No rashes or concerning lesions on visible areas.  Nodes: Inguinal areas: No adenopathy.  Pelvic Exam: External genitalia normal, normal bartholin/skene/urethral meatus/anus., Vagina is rugated and well-estrogenized, cervix normal in appearance, no CMT, no bladder tenderness, uterus normal size, shape, and consistency, no adnexal masses or tenderness. ***A pap smear was performed.  ***A vaginal swab for GC/CT was collected.    {GYN LAB RESULTS:108396}      Impression: 30 year old No obstetric history on file. female presents for   Chief Complaint   Patient presents with   . Abdominal Pain     right side she does have a hx of ovarian cysts       There are no diagnoses linked to this encounter.

## 2016-01-11 NOTE — Patient Instructions (Signed)
It was very nice to meet you today. Thank you for choosing the [facility].  I will call you by phone with results of the ultrasound to discuss next steps.  Please go to the emergency room if you have severe pain, vomiting, fevers/chills, heavy vaginal bleeding, or any other concerns.

## 2016-01-13 ENCOUNTER — Encounter (HOSPITAL_BASED_OUTPATIENT_CLINIC_OR_DEPARTMENT_OTHER): Payer: Self-pay | Admitting: Gynecologic Oncology

## 2016-01-13 ENCOUNTER — Encounter (HOSPITAL_BASED_OUTPATIENT_CLINIC_OR_DEPARTMENT_OTHER): Payer: Self-pay | Admitting: Neurology

## 2016-01-13 DIAGNOSIS — E282 Polycystic ovarian syndrome: Secondary | ICD-10-CM | POA: Insufficient documentation

## 2016-01-13 NOTE — Progress Notes (Signed)
[place]  NEW GYNECOLOGY VISIT    ID/CC: 30 year old G56 female presents for a new gynecology visit for RLQ pain, history of R ovarian cyst.    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    She presents today to discus recurrent RLQ pain. She reports that in 2015, she had similar pain and was found to have an 8cm cyst on the right ovary. She underwent a laparoscopic cystectomy. Was told that it was benign but does not recall the type of cyst; remembers that it was not a teratoma.    This pain has been present for about 2 weeks. She has also been noticing some breast enlargement which also happened for her the last time that she had a cyst; denies breast tenderness, nipple discharge. Has been having some nausea but this is not new and she associates this with migraine headaches (for which she is followed at the [facility] headache clinic and receiving Botox therapy.) Reports that the pain is dull and persistent in the RLQ. A few days ago felt more 'stabby.'  Nothing seems to bring it on; ibuprofen makes it go away. Has not had any vaginal bleeding. Last time she had any bleeding was 6 weeks ago.    Is sexually active but in a long distance relationship an has not had sex for about 8 weeks. Boyfriend is back in [place].  IUD has been in place for 3 months (replaced); this is her 2nd. Has rare bleeding with the Mirena. Had the initial one placed in 2012.    Pap history: Several years ago had a Pap that was positive for HPV, then had a colposcopy - which was negative. Most recent Pap smear was 08/04/2014 and was NILM (reviewed in [place]).    Has been diagnosed with PCOS. Prior to being on Mirena IUD, had very rare periods - 1-2 times a year. Struggles with hirsutism, has unwanted chin hair as well as on the abdomen and lower back. Previously  received electrolysis, now just plucking. Was briefly on metformin at the suggestion of a physician she was dating at the time, however, did not tolerate GI side effects and as her HgA1C was always normal she discontinued.    OB History     Gravida Para Term Preterm AB Living    0 0 0 0 0 0    SAB TAB Ectopic Multiple Live Births    0 0 0 0 0              Current Outpatient Prescriptions   Medication Sig Dispense Refill   . Melatonin 5 MG Oral Tab Take by mouth at bedtime.     . Naproxen 500 MG Oral Tab Take 550 mg by mouth one time a week.     . Ondansetron HCl 8 MG Oral Tab Take 1 tablet (8 mg) by mouth every 8 hours as needed for nausea/vomiting. 12 tablet 11   . SUMAtriptan Succinate 100 MG Oral Tab Take 1 tablet (100 mg) by mouth every 2 hours as needed for migraines. Take at onset of migraine. May repeat x 1 dose. Max 200 mg/24 hours 9 tablet 11   . Venlafaxine HCl ER 75 MG Oral CAPSULE SR 24 HR Take 1 capsule (75 mg) by mouth daily. 30 capsule 11     No current facility-administered medications for  this visit.        Review of patient's allergies indicates:  Allergies   Allergen Reactions   . Amoxicillin Hives   . Pecan Pollen Allergy Skin Test        Patient Active Problem List    Diagnosis Date Noted   . PCOS (polycystic ovarian syndrome) [E28.2] 01/13/2016   . History of removal of ovarian cyst [Z98.890, Z87.42] 01/11/2016     S/p laparoscopic ovarian cystectomy 12/2013 [name]) for 8cm right ovarian cyst, benign. Awaiting records.     . Chronic migraine without aura with status migrainosus, not intractable [G43.701] 12/03/2015       Past Surgical History:   Procedure Laterality Date   . Laparoscopic ovarian cystectomy Right 2015   . TONSILLECTOMY ONE-HALF <AGE 13         Family History     Problem (# of Occurrences) Relation (Name,Age of Onset)    Migraine Headaches (1) Mother          Social History   Substance Use Topics   . Smoking status: Never Smoker   . Smokeless tobacco: Never Used   . Alcohol use  Yes      Comment: 3 per week         There is no immunization history on file for this patient.    ROS:  As described in HPI.      Physical Exam:  1995   Detailed - 5-7 systems, Comprehensive -8+  BP 113/75   Pulse 98   Temp 98.4 F (36.9 C) (Temporal)   General: healthy, alert, no distress.  Cardiac: Normal pulses in the lower extremities with normal capillary refill, No edema or varicosities.  Respiratory: Normal respiratory effort and chest wall movement with respiration.   Abdomen: Soft, non-tender. BS normal. No masses or organomegaly.   Psychiatric:   Mood/affect:  Normal.  Orientation: oriented to time, person and place  Neurologic:  Gait:  Normal.  Skin: Skin color, texture, turgor normal. No rashes or concerning lesions on visible areas.  Pelvic Exam: External genitalia normal, normal bartholin/skene/urethral meatus/anus. Vagina is rugated and well-estrogenized, cervix normal in appearance, no CMT, no bladder tenderness, uterus normal size, shape, and consistency, no adnexal masses or tenderness.    1. URINE PREGNANCY TEST HCG, ONSITE   Result Value Ref Range    Pregnancy (HCG) ([facility]), URN Negative     INTERNAL CONTROL Control Verified      Impression: 30 year old G42 female presents for RLQ pain and history of laparoscopic cystectomy for 8cm R ovarian cyst.    RLQ pain, history of removal of R ovarian cyst: No adnexal masses or tenderness appreciated on today's exam. Discussed that it is certainly possible that a cyst has recurred but is not able to be appreciated on today's exam; will obtain pelvic US. Discussed that if a large cyst has recurred, would likely recommend surgery given that she is having pain and this would be the second recurrence of a cyst on that side. Discussed the differential of adnexal masses in a young ovulating woman. Plan to obtain pelvic US and then follow up by eCare / phone for discussion of results.  -     US PELVIS COMPLETE  W TRANSVAG  -     URINE PREGNANCY TEST HCG,  ONSITE  - Patient signed ROI - will request surgery and pathology records from [place]: Pap smear NILM 08/04/2014 (seen in [place]. Will obtain prior Pap records  from [place].    Patient seen with Dr. Vira Agar, Attending Physician.  Follow up: by eCare / phone for discussion of pelvic US, further as needed.

## 2016-01-14 NOTE — Progress Notes (Signed)
I saw and evaluated the patient. I have reviewed the resident's documentation and agree with it.  Abril Cappiello, MD

## 2016-01-14 NOTE — Telephone Encounter (Signed)
Called the patient and left a message  to schedule an appointment with Dr.Kanter to discuss your request with her.

## 2016-01-14 NOTE — Telephone Encounter (Signed)
Plan : Refer to Dr.Kanter.

## 2016-01-28 ENCOUNTER — Encounter (HOSPITAL_BASED_OUTPATIENT_CLINIC_OR_DEPARTMENT_OTHER): Payer: Self-pay

## 2016-04-02 ENCOUNTER — Ambulatory Visit: Payer: Commercial Managed Care - PPO | Attending: Family | Admitting: Family

## 2016-04-02 VITALS — BP 118/77 | HR 86 | Ht 60.98 in | Wt 135.8 lb

## 2016-04-02 DIAGNOSIS — G43701 Chronic migraine without aura, not intractable, with status migrainosus: Secondary | ICD-10-CM | POA: Insufficient documentation

## 2016-04-02 MED ORDER — ONABOTULINUMTOXINA 100 UNITS IJ SOLR
100.0000 [IU] | Freq: Once | INTRAMUSCULAR | Status: AC
Start: 2016-04-02 — End: 2016-04-02
  Administered 2016-04-02: 200 [IU]

## 2016-04-02 NOTE — Progress Notes (Signed)
[place] PROCEDURE NOTE    Chief Complaint   Patient presents with   . Migraine     IDENTIFICATION DATA  [name],  first presented to the clinic on 12/03/15, evaluated by Dr. Geanie Kenning  [name],is a pleasant 31 year old female  With diagnosis of  (G43.701) Chronic migraine without aura with status migrainosus, not intractable  (primary encounter diagnosis).  Currently has near-daily mild headache, with migraine about once a week, has had up to 3 [name] or more per week of migraine in the past.  Just started Botox prior to her move to [facility], and seeing some improvement with that. She is doing a great job with lifestyle management including exercise and diet.  May be treating her migraines a little too late for sumatriptan to be optimally effective.    INTERVAL HISTORY  I saw Erica [name] for the first time in October for Botox  injection #1 at this clinic for medical necessity for chronic migraines. We received PA for 4 treatments, expiry in October 2018. She had Botox once in [place].  She is here today for Botox # 2  After Botox # 1, she had 3 weeks without migraines.  Mild  "droopy" eyelids after last botox injections: will inject higher up in frontalis today.    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   She would like to discuss stopping venlafaxine and taking Wellbutrin; states was prescribed by neurologist for both headaches and mood.     ASSESSMENT    (G43.701) Chronic migraine without aura with status migrainosus, not intractable  (primary encounter diagnosis)    PROCEDURE:  [id number] - Botulinum Toxin A - Botox 200U   [id number] - Chemodenervation of facial/trigeminal/cerv msc and accessory nerves bilaterally (migraines)  200U of  Botox was given (155U injected + 45U wasted).     The risk and benefits of the procedure were explained and she agreed to have the procedure performed. Written consent form signed.     Injection Summary   The patient  received 155U of BOTOX in 31 injections. The following muscles were injected:   Amount Muscle   5 U Procerus   5 U Corrugator Supercilii (left)   5 U Corrugator Supercilii (right)   10 U Epicranius - Occipitofrontalis Venter Frontalis (right)   10 U Epicranius - Occipitofrontalis Venter Frontalis (left)   20 U Temporalis (right)   20 U Temporalis (left)   15 U Venter Occipitalis (right)   15 U Venter Occipitalis (left)   10 U Semispinalis capitis (left)   10 U Semispinalis capitis (right)   15 U Trapezius (right)  15 U Trapezius (left)   45 units wasted     The patient  tolerated the BOTOX  well with no side effects.     PLAN:   1.  Follow up in 3 months for Botox injections.Current PA expires in October 2018.   2. Schedule follow up appointment.

## 2016-04-17 ENCOUNTER — Ambulatory Visit: Payer: Commercial Managed Care - PPO | Attending: Neurology | Admitting: Neurology

## 2016-04-17 VITALS — BP 103/72 | HR 61 | Ht 60.98 in | Wt 133.2 lb

## 2016-04-17 DIAGNOSIS — G43701 Chronic migraine without aura, not intractable, with status migrainosus: Secondary | ICD-10-CM | POA: Insufficient documentation

## 2016-04-17 DIAGNOSIS — Z6825 Body mass index (BMI) 25.0-25.9, adult: Secondary | ICD-10-CM

## 2016-04-17 MED ORDER — DIAZEPAM 2 MG OR TABS
2.0000 mg | ORAL_TABLET | Freq: Once | ORAL | 0 refills | Status: AC | PRN
Start: 2016-04-17 — End: 2016-04-17

## 2016-04-17 MED ORDER — DULOXETINE HCL 20 MG OR CPEP
DELAYED_RELEASE_CAPSULE | ORAL | 11 refills | Status: DC
Start: 2016-04-17 — End: 2016-12-21

## 2016-04-17 MED ORDER — OTHER MEDICATION (SEE SIG/INSTRUCTIONS)
0 refills | Status: AC
Start: 2016-04-17 — End: ?

## 2016-04-17 NOTE — Progress Notes (Signed)
NEUROLOGY CLINIC FOLLOW-UP NOTE     IDENTIFICATION/CHIEF COMPLAINT:  Ms. Slutsky is a 31 year old year-old female seen in follow-up for chronic migraine without aura.  Previous preventive med trials include topiramate (side effects), propranolol (ineffective), amitriptyline (side effects).      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, under a lot of stress right now and knows this is probably contributing to symptoms  - just had 3rd Botox, feels she bounces back from migraine faster but overall frequency is not improved, would like to continue to see if things change when her stress levels go down  - trying to take Imitrex sooner which is helpful, not running out of it every month, helps with pain but she still loses a lot of productivity due to other symptoms like brain fog  - wondering if she can have Rx for Valium for occasional use such as flying, has been using an old prescription for 2 years which has run out      [place] Prescriptions Marked as Taking for the 04/17/16 encounter (Office Visit) with Eilene Ghazi, MD   Medication Sig Dispense Refill   . Cholecalciferol (VITAMIN D3) 1000 units Oral Tab Take 1,000 Units by mouth daily.     . Melatonin 5 MG Oral Tab Take by mouth at bedtime.     . Naproxen 500 MG Oral Tab Take 550 mg by mouth one time a week.     . Ondansetron HCl 8 MG Oral Tab Take 1 tablet (8 mg) by mouth every 8 hours as needed for nausea/vomiting. 12 tablet 11   . SUMAtriptan Succinate 100 MG Oral Tab Take 1 tablet (100 mg) by mouth every 2 hours as needed for migraines. Take at onset of migraine. May repeat x 1 dose. Max 200 mg/24 hours 9 tablet 11   . Venlafaxine HCl ER 75 MG Oral CAPSULE SR 24 HR Take 1 capsule (75 mg) by mouth daily. 30 capsule 11                PHYSICAL EXAMINATION     VITAL SIGNS:  BP 103/72   Pulse 61   Ht 5' 0.98" (1.549 m)   Wt 133 lb 3.2 oz (60.4 kg)   BMI 25.18 kg/m       ASSESSMENT AND PLAN  31 y/o woman seen in f/u for chronic migraine, likely exacerbated due to dissertation stress.  Plan discussed as follows:  - continue with Botox for now, auth is good through next fall, by then we will have a better sense of what it is doing or not  - cross-taper Effexor to Cymbalta  - trial of Cefaly device  - given one-time Rx for diazepam, discussed that I am not comfortable being a long-term prescriber for this medication since it is a controlled substance and not directly pertinent to her migraines    RTC 3 months      I spent over 25 minutes face-to-face with the patient. More than 50% of this time was spent in counseling regarding the diagnosis and treatment options.

## 2016-04-21 ENCOUNTER — Encounter (HOSPITAL_BASED_OUTPATIENT_CLINIC_OR_DEPARTMENT_OTHER): Payer: Self-pay

## 2016-04-21 NOTE — Progress Notes (Signed)
Have emailed Cefaly order to [email or web address] Received confirmation. See below:    Dear Rod Holler,    Thank you for the prescription; we received it well.    Kind regards,  --       [facility] [facility] [employer] [facility]  [address] - [place]  Tel.: [phone] -- Fax: [phone]  Web: [email or web address] ([place] / [email or web address] Audiological scientist)      Hello,    [place] order for [name]  Thank you  Santa Lighter

## 2016-06-26 ENCOUNTER — Ambulatory Visit (HOSPITAL_BASED_OUTPATIENT_CLINIC_OR_DEPARTMENT_OTHER): Payer: Commercial Managed Care - PPO | Admitting: Neurology

## 2016-06-26 ENCOUNTER — Ambulatory Visit: Payer: Commercial Managed Care - PPO | Attending: Family | Admitting: Family

## 2016-06-26 VITALS — BP 113/73 | HR 69 | Ht 60.98 in | Wt 136.2 lb

## 2016-06-26 DIAGNOSIS — G43701 Chronic migraine without aura, not intractable, with status migrainosus: Secondary | ICD-10-CM | POA: Insufficient documentation

## 2016-06-26 DIAGNOSIS — Z6825 Body mass index (BMI) 25.0-25.9, adult: Secondary | ICD-10-CM

## 2016-06-26 MED ORDER — KETOROLAC TROMETHAMINE 10 MG OR TABS
10.0000 mg | ORAL_TABLET | ORAL | 11 refills | Status: DC | PRN
Start: 2016-06-26 — End: 2018-10-01

## 2016-06-26 MED ORDER — SUMATRIPTAN SUCCINATE 6 MG/0.5ML SC SOAJ
6.0000 mg | Freq: Once | SUBCUTANEOUS | 11 refills | Status: AC | PRN
Start: 2016-06-26 — End: 2016-06-26

## 2016-06-26 MED ORDER — ONABOTULINUMTOXINA 100 UNITS IJ SOLR
100.0000 [IU] | Freq: Once | INTRAMUSCULAR | Status: AC
Start: 2016-06-26 — End: 2016-06-26
  Administered 2016-06-26: 200 [IU]

## 2016-06-26 NOTE — Progress Notes (Signed)
NEUROLOGY CLINIC FOLLOW-UP NOTE     IDENTIFICATION/CHIEF COMPLAINT:  Erica [name] is a 31 year old year-old female seen in follow-up for chronic migraine without aura.  Previous preventive med trials include topiramate (side effects), propranolol (ineffective), amitriptyline (side effects).    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, ended up going to urgent care for IM Toradol and Imitrex which was helpful, migraines have better since then and she's hopeful they will be better in general since her stress level is lower; wondering if she can try Rx for PO Toradol as a back up to avoid another UC visit  - taper to duloxetine went well, mood improved, would like to stay at this dose and maybe try tapering off later in the year  - planning to see a traditional [place] practitioner soon, wondering if I have any concerns about tihs  - hasn't gotten around to ordering Cefaly yet but probably will  - has one tender point in suboccipital area, wondering if she can get a little extra Botox there        CURRENT MEDICATIONS  Outpatient Prescriptions Marked as Taking for the 06/26/16 encounter (Office Visit) with Eilene Ghazi, MD   Medication Sig Dispense Refill    Cholecalciferol (VITAMIN D3) 1000 units Oral Tab Take 1,000 Units by mouth daily.      DULoxetine HCl 20 MG Oral CAPSULE ENTERIC COATED PARTICLES Start one capsule daily in addition to Effexor 75 mg for 2 weeks, then increase to two capsules daily and stop Effexor (Patient taking differently: 40 mg. ) 60 capsule 11    Melatonin 5 MG Oral Tab Take by mouth at bedtime.      Naproxen 500 MG Oral Tab Take 550 mg by mouth daily as needed.       Ondansetron HCl 8 MG Oral Tab Take 1 tablet (8 mg) by mouth every 8 hours as needed for nausea/vomiting. 12 tablet 11    SUMAtriptan Succinate 100 MG Oral Tab Take 1 tablet (100 mg) by mouth every 2 hours as needed for migraines. Take at onset of migraine. May repeat x  1 dose. Max 200 mg/24 hours 9 tablet 11         PHYSICAL EXAMINATION     VITAL SIGNS:  BP 113/73    Pulse 69    Ht 5' 0.98" (1.549 m)    Wt 136 lb 3.2 oz (61.8 kg)    BMI 25.75 kg/m       ASSESSMENT AND PLAN  31 y/o woman seen in f/u for chronic migraine without aura.  7-day migraine after the dissertation defense was likely a stress let-down event, and we're both hopeful that things will continue to improve.  Plan discussed as follows:  - continue Botox  - try Cefaly device  - Rx given for PO ketorolac and SQ Imitrex as back-up for more severe or prolonged migraine  - continue duloxetine 40 mg for now, I am happy to try tapering whenever she wants  - I have no significant concerns about trying [place] medicine, since she's not on any meds that raise particular concerns for interaction with herbal treatments  - recommended to show Mui the trigger point in her neck before they do Botox, should be able to get an extra 5-10 units there without causing significant weakness    RTC 3-4 months

## 2016-06-26 NOTE — Progress Notes (Signed)
[facility] PROCEDURE NOTE    Chief Complaint   Patient presents with    Migraine     IDENTIFICATION DATA  Erica [name],  first presented to the clinic on 12/03/15, evaluated by Dr. Geanie Kenning  Erica [name],is a pleasant 31 year old female  With diagnosis of  (G43.701) Chronic migraine without aura with status migrainosus, not intractable  (primary encounter diagnosis).  Currently has near-daily mild headache, with migraine about once a week, has had up to 3 [name] or more per week of migraine in the past.  Just started Botox prior to her move to [facility], and seeing some improvement with that. She is doing a great job with lifestyle management including exercise and diet.  May be treating her migraines a little too late for sumatriptan to be optimally effective.    INTERVAL HISTORY  I saw Erica [name] for the first time in October for Botox  injection #1 at this clinic for medical necessity for chronic migraines. We received PA for 4 treatments, expiry in October 2018. She had Botox once in [place].     Botox # 2 last in Feb 2018  After Botox # 1, she had 3 weeks without migraines.  Mild  "droopy" eyelids after botox # 2 - injected higher up in frontalis today.    No side effects with Botox # 2  Saw Dr. 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     ASSESSMENT    (G43.701) Chronic migraine without aura with status migrainosus, not intractable  (primary encounter diagnosis)    PROCEDURE:  W2585 - Botulinum Toxin A - Botox 200U   27782 - Chemodenervation of facial/trigeminal/cerv msc and accessory nerves  bilaterally (migraines)  200U of Botox was given (155U injected + 45U wasted).     The risk and benefits of the procedure were explained and she agreed to have the procedure performed. Written consent form signed.     Injection Summary   The patient  received 155U of BOTOX in 31 injections. The following muscles were injected:   Amount Muscle   5 U Procerus   5 U Corrugator Supercilii (left)   5 U Corrugator Supercilii (right)   10 U Epicranius - Occipitofrontalis Venter Frontalis (right)   10 U Epicranius - Occipitofrontalis Venter Frontalis (left)   20 U Temporalis (right)   20 U Temporalis (left)   15 U Venter Occipitalis (right)   15 U Venter Occipitalis (left)   10 U Semispinalis capitis (left)   10 U Semispinalis capitis (right) + 5 units   15 U Trapezius (right) + 5 units  15 U Trapezius (left)     35 units wasted     The patient  tolerated the BOTOX  well with no side effects.     PLAN:   1.  Follow up in 3 months for Botox injections.Current PA expires in October 2018.   2. Schedule follow up appointment.

## 2016-07-31 ENCOUNTER — Encounter (HOSPITAL_BASED_OUTPATIENT_CLINIC_OR_DEPARTMENT_OTHER): Payer: Self-pay | Admitting: Neurology

## 2016-07-31 NOTE — Telephone Encounter (Signed)
Plan : Refer to Dr.Kanter.

## 2016-09-23 ENCOUNTER — Encounter (HOSPITAL_BASED_OUTPATIENT_CLINIC_OR_DEPARTMENT_OTHER): Payer: Self-pay | Admitting: Family

## 2016-09-23 ENCOUNTER — Ambulatory Visit: Payer: Commercial Managed Care - PPO | Attending: Family | Admitting: Family

## 2016-09-23 VITALS — BP 116/88 | HR 93 | Ht 60.0 in | Wt 132.2 lb

## 2016-09-23 DIAGNOSIS — G43701 Chronic migraine without aura, not intractable, with status migrainosus: Secondary | ICD-10-CM | POA: Insufficient documentation

## 2016-09-23 MED ORDER — ONABOTULINUMTOXINA 100 UNITS IJ SOLR
100.0000 [IU] | Freq: Once | INTRAMUSCULAR | Status: AC
Start: 2016-09-23 — End: 2016-09-23
  Administered 2016-09-23: 200 [IU]

## 2016-09-23 NOTE — Progress Notes (Signed)
[facility] PROCEDURE NOTE    Chief Complaint   Patient presents with   . Migraine     Botox Inj   . Headache     IDENTIFICATION DATA  [name],  first presented to the clinic on 12/03/15, evaluated by Dr. Geanie Kenning  [name],is a pleasant 31 year old female  With diagnosis of  (G43.701) Chronic migraine without aura with status migrainosus, not intractable  (primary encounter diagnosis).  Currently has near-daily mild headache, with migraine about once a week, has had up to 3 [name] or more per week of migraine in the past.  Just started Botox prior to her move to [facility], and seeing some improvement with that. She is doing a great job with lifestyle management including exercise and diet.  May be treating her migraines a little too late for sumatriptan to be optimally effective.    INTERVAL HISTORY  Erica [name] is here today for Botox  injection for medical necessity for chronic migraines. We received PA for 4 treatments, expiry in October 2018. She had Botox once in [place].    Botox # 2 last in Feb 2018  Botox # 3 on 06/26/16   After Botox # 1, she had 3 weeks without migraines.  Mild  "droopy" eyelids after botox # 2 - injected higher up in frontalis with no further side effect    Overall headaches much improved when she was on a month of vacation in [place], [place] back, had a migraine. Average 12 [name] a month with botox therapy     Headaches more on the right side  Light and sound sensitivity, nausea, muscle tension in neck and shoulders  Massage therapy helpful    She heard about Aimovig, would like to take a wait and see approach for now  Trying a new app called curables, changed her approach to pain .     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     ASSESSMENT    (G43.701) Chronic migraine without aura with status migrainosus, not intractable  (primary encounter  diagnosis)    PROCEDURE:  T2671 - Botulinum Toxin A - Botox 200U   24580 - Chemodenervation of facial/trigeminal/cerv msc and accessory nerves bilaterally (migraines)  200U of Botox was given (155U injected + 45U wasted).     The risk and benefits of the procedure were explained and she agreed to have the procedure performed. Written consent form signed.     Injection Summary   The patient  received 155U of BOTOX in 31 injections. The following muscles were injected:   Amount Muscle   5 U Procerus   5 U Corrugator Supercilii (left)   5 U Corrugator Supercilii (right)   10 U Epicranius - Occipitofrontalis Venter Frontalis (right)   10 U Epicranius - Occipitofrontalis Venter Frontalis (left)   20 U Temporalis (right)   20 U Temporalis (left)   15 U Venter Occipitalis (right)   15 U Venter Occipitalis (left)   10 U Semispinalis capitis (left)   10 U Semispinalis capitis (right) + 5 units   15 U Trapezius (right) + 5 units  15 U Trapezius (left)     35 units wasted     The patient  tolerated the BOTOX  well with no side effects.     PLAN:   1.  Follow up in 3 months for Botox  injections.Current PA expires in October 2018.

## 2016-12-21 ENCOUNTER — Other Ambulatory Visit (HOSPITAL_BASED_OUTPATIENT_CLINIC_OR_DEPARTMENT_OTHER): Payer: Self-pay | Admitting: Neurology

## 2016-12-21 DIAGNOSIS — G43701 Chronic migraine without aura, not intractable, with status migrainosus: Secondary | ICD-10-CM

## 2016-12-23 MED ORDER — DULOXETINE HCL 60 MG OR CPEP
60.0000 mg | DELAYED_RELEASE_CAPSULE | Freq: Every day | ORAL | 11 refills | Status: DC
Start: 2016-12-23 — End: 2017-12-21

## 2016-12-23 NOTE — Telephone Encounter (Signed)
Per pt comment below-    I'd like to increase to 60 mg daily     OK to increase dose?  Defer to MD to approve refills

## 2017-01-28 ENCOUNTER — Ambulatory Visit: Payer: Commercial Managed Care - PPO | Attending: Physician Assistant | Admitting: Physician Assistant

## 2017-01-28 VITALS — BP 112/77 | HR 76 | Ht 60.0 in | Wt 136.0 lb

## 2017-01-28 DIAGNOSIS — G43701 Chronic migraine without aura, not intractable, with status migrainosus: Secondary | ICD-10-CM | POA: Insufficient documentation

## 2017-01-28 MED ORDER — ONABOTULINUMTOXINA 100 UNITS IJ SOLR
200.0000 [IU] | Freq: Once | INTRAMUSCULAR | Status: AC
Start: 2017-01-28 — End: 2017-01-28
  Administered 2017-01-28: 200 [IU]

## 2017-01-28 NOTE — Patient Instructions (Signed)
Assessment:   Diagnosis: (G43.701) Chronic migraine without aura with status migrainosus, not intractable  (primary encounter diagnosis)    Recommendations:   Botox was mixed 2cc sterile preservative free saline per vial. Patient signed the consent form. The risks and nature of the procedure were explained and She agreed to have the procedure performed. Patient should return in 3 months for evaluation.   Injection Summary   Patient received 155U of BOTOX in 31 injections. The following muscles were injected:   Amount Muscle   5 U Proceros   5 U Corrugator Supercilii (left)   5 U Corrugator Supercilii (right)   10 U Epicranius - Occipitofrontalis Venter Frontalis (right)   10 U Epicranius - Occipitofrontalis Venter Frontalis (left)   20 U Temporalis (right)   20 U Temporalis (left)   15 U Venter Occipitalis (right)   15 U Venter Occipitalis (left)   10 U Semispinalis capitis (left)   10 U Semispinalis capitis (right)   15 U Trapezius (right)   15 U Trapezius (left)   45 units wasted   The patient tolerated the BOTOX well with no side effects.           CLINICAL SUMMARY:  Erica [name] is coming today for Botox for medical necessity of treatment of chronic migraines.  Botox works well for her migraines.  We proceeded with Botox 155 units.    Plan to follow-up in 3 months for next Botox treatment or sooner PRN.

## 2017-01-28 NOTE — Progress Notes (Signed)
NEUROLOGY CLINIC NOTE  Erica [name] comes today for botox injection for chronic migraine.   We received approval for Botox.     Headache interval history:    Erica [name] to the clinic on 12/03/2015, evaluated by Dr. Geanie Kenning.  Erica [name] is a pleasant 31 year old female with diagnosis of (G43.701) Chronic migraine without aura with status migrainosus, not intractable  (primary encounter diagnosis).  Currently has near-daily mild headache, with migraine about once a week, has had up to 3 [name] or more per week of migraine in the past. She is doing a great job with lifestyle management including exercise and diet.   - Botox #1, after treatment she had 3 weeks without migraines.  - Botox #2 last in Feb 2018  - Botox #3 on 06/26/16 - Mild  "droopy" eyelids after botox # 2 - injected higher up in frontalis with no further side effect  - Botox #4 on 09/23/2016 (by [place])  - Botox #5 on 01/28/2017 (by Dorrene German PA-C) Today     Overall headaches much improved with Botox treatment, one headache every other week.   She will go to [place] where she will meet her mom and her fiance from [place].    Headaches more on the right side, especially in the back.   Light and sound sensitivity, nausea, muscle tension in neck and shoulders  Massage therapy helpful    She shows interest in vagal nerve stimulation, she will make a future appt for a [employer].     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        Important Limitations   Safety and effectiveness have not been established for the prophylaxis of episodic migraine (14 headache [name] or fewer per month) in 7 placebo-controlled studies.   IMPORTANT SAFETY INFORMATION, INCLUDING BOXED WARNING   Distant Spread of Toxin Effect :Post marketing reports indicate that the effects of BOTOX and all botulinum toxin products may spread from the area of injection to  produce symptoms consistent with botulinum toxin effects. These may include asthenia, generalized muscle weakness, diplopia, ptosis, dysphagia, dysphonia, dysarthria, urinary incontinence, and breathing difficulties. These symptoms have been reported hours to weeks after injection. Swallowing and breathing difficulties can be life threatening, and there have been reports of death. The risk of symptoms is probably greatest in children treated for spasticity, but symptoms can also occur in adults treated for spasticity and other conditions, particularly in those patients who have underlying conditions that would predispose them to these symptoms. In unapproved uses, including spasticity in children, and in approved indications, cases of spread of effect have been reported at doses comparable to those used to treat cervical dystonia and at lower doses.     BOTOX was evaluated in 2 randomized, multicenter, 24-week, 2-injection-cycle, placebo controlled double-blind studies. Study 1 and study 2 included Chronic Migraine adults ([id number]) who were not using any concurrent headache prophylaxis, and during a 28-day baseline period had > 15 headache [name] lasting 4 hours or more, with > 50% being migraine/probable migraine.1 The most frequently reported adverse reactions following injection of BOTOX for Chronic Migraine include neck pain (9%), headache (5%), eyelid ptosis (4%), migraine (4%), muscular weakness (4%), musculoskeletal stiffness (4%), bronchitis (3%), injection-site pain (3%), musculoskeletal pain (3%), myalgia (3%), facial paresis (2%), hypertension (2%), and muscle spasms (2%).   I request confirmation that this therapy is a covered benefit  based on medical necessity and that associated fees will be covered. Thank you for your review of this information and for your coverage consideration. If you have any questions or require additional information, please contact me.     IMPORTANT SAFETY INFORMATION    CONTRAINDICATIONS :BOTOX is contraindicated in the presence of infection at the proposed injection site(s) and in individuals with known hypersensitivity to any botulinum toxin preparation or to any of the components in the formulation.   Hypersensitivity Reactions :Serious and/or immediate hypersensitivity reactions have been reported. These reactions include anaphylaxis, serum sickness, urticaria, soft-tissue edema, and dyspnea. If such a reaction occurs, further injection of BOTOX should be discontinued and appropriate medical therapy immediately instituted. One fatal case of anaphylaxis has been reported in which lidocaine was used as the diluent, and consequently the causal agent cannot be reliably determined.   Chronic Migraine:The most frequently reported adverse reactions following injection of BOTOX for chronic migraine include neck pain (9%), headache (5%), eyelid ptosis (4%), migraine (4%), muscular weakness (4%), musculoskeletal stiffness (4%), bronchitis (3%), injection-site pain (3%), musculoskeletal pain (3%), myalgia (3%), facial paresis (2%), hypertension (2%), and muscle spasms (2%).   Post Marketing Experience:There have been spontaneous reports of death, sometimes associated with dysphagia, pneumonia, and/or other significant debility or anaphylaxis, after treatment with botulinum toxin. There have also been reports of adverse events involving the cardiovascular system, including arrhythmia and myocardial infarction, some with fatal outcomes. Some of these patients had risk factors including cardiovascular disease. The exact relationship of these events to the botulinum toxin injection has not been established.     OBJECTIVE:  BP 112/77    Pulse 76    Ht 5' (1.524 m)    Wt 136 lb (61.7 kg)    BMI 26.56 kg/m   Appearance: healthy, alert, no distress and cooperative.  Neurological Exam: Alert and oriented x 3.  Speech normal, Muscle tone and strength normal and symmetric, Reflexes normal and  symmetric and Sensation grossly normal.    Pre procedure verification:  Verified allergies and relevant documentation, standard hand hygiene observed.     Procedures:   O1308 - Botulinum Toxin A - Botox 200U   65784 - Chemodenervation of innervated by facial nerve  and chemodenervation of cervical spinal muscles   200U of BOTOX was given (155U injected + 45U wasted).     The risk and benefits of the procedure were explained and she agreed to have the procedure performed. Written consent form signed.       Assessment:   Diagnosis: (G43.701) Chronic migraine without aura with status migrainosus, not intractable  (primary encounter diagnosis)    Recommendations:   Botox was mixed 2cc sterile preservative free saline per vial. Patient signed the consent form. The risks and nature of the procedure were explained and She agreed to have the procedure performed. Patient should return in 3 months for evaluation.   Injection Summary   Patient received 155U of BOTOX in 31 injections. The following muscles were injected:   Amount Muscle   5 U Proceros   5 U Corrugator Supercilii (left)   5 U Corrugator Supercilii (right)   10 U Epicranius - Occipitofrontalis Venter Frontalis (right)   10 U Epicranius - Occipitofrontalis Venter Frontalis (left)   20 U Temporalis (right)   20 U Temporalis (left)   15 U Venter Occipitalis (right)   15 U Venter Occipitalis (left)   10 U Semispinalis capitis (left)   10 U Semispinalis capitis (right)  15 U Trapezius (right)   15 U Trapezius (left)   45 units wasted   The patient tolerated the BOTOX well with no side effects.           CLINICAL SUMMARY:  Erica [name] is coming today for Botox for medical necessity of treatment of chronic migraines.  Botox works well for her migraines.  We proceeded with Botox 155 units.    Plan to follow-up in 3 months for next Botox treatment or sooner PRN.

## 2017-07-27 ENCOUNTER — Other Ambulatory Visit: Payer: Self-pay

## 2017-08-06 NOTE — Progress Notes (Signed)
[place] VISIT    ID/CC:  Erica [name] is a 32 year old female presenting for an annual preventive visit.    I reviewed the patients current [facility] Preventive Health Visit form, and I reviewed her past medical history, past surgical history, family history and social history as documented in Epic, confirmed at this visit.     Lifestyle is healthy & low-risk in the following areas:   Diet:  YES    Exercise:  YES    Physical safety & risk of injury:  YES    Exposure to tobacco:  YES   Exposure to alcohol: YES   Use of other substances: YES       Counseling on lifestyle & health habits at this visit:  See plan below    The following areas pose potential risk to health:   Depression:  No   Cancer risk factors:  No  Cardiovascular risk factors:  No   Potential for exposure to infection: No  Potential for unintended pregnancy:  No      Counseling on screening, prevention, & preventive management at this visit: see plan below    Erica [name] reports overall doing well. She and her partner recently got married and are planning to start trying for pregnancy in a year or so. She would like to come off her SSRI before then. Otherwise no concerns.     Gyn History:   Menarche/Menses: menarche at age 52; irregular cycles 2/2 PCOS; amenorrheic with Mirena IUD now   Menopause/HRT history: N/a  Gyn Surgeries: RA laparoscopic ROV cystectomy  STIs history: None known  Pap smear history: last pap 08/04/2014 NILM  Sexually active: Yes with female partner  Sexual dysfunction (dyspareunia, anorgasmia, low libido): None  Contraception: Mirena IUD    OB History     Gravida   0    Para   0    Term   0    Preterm   0    AB   0    Living   0       SAB   0    TAB   0    Ectopic   0    Multiple   0    Live Births   0                 Current Outpatient Medications   Medication Sig Dispense Refill    Cholecalciferol (VITAMIN D3) 1000 units Oral Tab Take 1,000 Units by mouth daily.      DULoxetine HCl 60  MG Oral CAPSULE ENTERIC COATED PARTICLES Take 1 capsule (60 mg) by mouth daily. 30 capsule 11    Ketorolac Tromethamine 10 MG Oral Tab Take 1 tablet (10 mg) by mouth every 4 hours as needed for pain. 20 tablet 11    Melatonin 5 MG Oral Tab Take by mouth at bedtime.      Naproxen 500 MG Oral Tab Take 550 mg by mouth daily as needed.       Ondansetron HCl 8 MG Oral Tab Take 1 tablet (8 mg) by mouth every 8 hours as needed for nausea/vomiting. 12 tablet 11    SUMAtriptan Succinate 100 MG Oral Tab Take 1 tablet (100 mg) by mouth every 2 hours as needed for migraines. Take at onset of migraine. May repeat x 1 dose. Max 200 mg/24 hours 9 tablet 11    Unclassified (OTHER MEDS, SEE COMMENTS,) Cefaly device,  use as directed (Patient not taking: Reported on 06/26/2016) 1 kit 0     No current facility-administered medications for this visit.        Review of patient's allergies indicates:  Allergies   Allergen Reactions    Amoxicillin Hives    Pecan Pollen Allergy Skin Test        Patient Active Problem List    Diagnosis Date Noted    PCOS (polycystic ovarian syndrome) [E28.2] 01/13/2016    History of removal of ovarian cyst [Z98.890, Z87.42] 01/11/2016     S/p laparoscopic ovarian cystectomy 12/2013 [facility]) for 8cm right ovarian cyst, benign. Awaiting records.      Chronic migraine without aura with status migrainosus, not intractable [G43.701] 12/03/2015       Past Surgical History:   Procedure Laterality Date    Laparoscopic ovarian cystectomy Right 2015    TONSILLECTOMY ONE-HALF <AGE 47         Family History     Problem (# of Occurrences) Relation (Name,Age of Onset)    Migraine Headaches (1) Mother          Social History     Tobacco Use    Smoking status: Never Smoker    Smokeless tobacco: Never Used   Substance Use Topics    Alcohol use: Yes     Comment: 3 per week         There is no immunization history on file for this patient.        Physical Exam:  1995   Detailed - 5-7 systems, Comprehensive  -8+  BP 117/80    Pulse 100    Ht '5\' 1"'$  (1.549 m)    Wt 134 lb (60.8 kg)    BMI 25.32 kg/m   General: healthy, alert, no distress.Cardiac: Normal pulses in the lower extremities with normal capillary refill, No edema or varicosities.  Respiratory: Normal respiratory effort and chest wall movement with respiration.   Abdomen: Soft, non-tender. BS normal. No masses or organomegaly.   Psychiatric:   Mood/affect:  Normal.  Orientation: oriented to time, person and place  Neurologic:  Gait:  Normal.  Skin: 3-78m raised mole with scab located in center of back; several additional moles present  Nodes: Inguinal areas: No adenopathy.  Pelvic Exam: External genitalia normal, normal bartholin/skene/urethral meatus/anus., Vagina is rugated and well-estrogenized, cervix normal in appearance, no CMT, no bladder tenderness, uterus normal size, shape, and consistency, no adnexal masses or tenderness. A pap smear was performed.  A vaginal swab for GC/CT was collected.    Impression: Erica Lurryis a 32year old female presenting for an annual preventive visit.    Health Maintenance   Topic Date Due    Depression Screening (PHQ-2)  08/06/1995    HIV Screening  08/06/1998    Tetanus Vaccine  08/06/2002    Cervical Cancer Screening  08/03/2017    Influenza Vaccine  Completed     #. HCM:   - Pap smear: Remote history of HPV positive s/p normal colpo; co-testing collected today  - Contraception: Mirena IUD  - Chlamydia/Gonorrhea screen: Collected today  - Screening for other STD's including HIV: Declined  - Cholesterol screening: per PCP   - Breast exam and Mammography: Discussed breast awareness; routine screening at 40-50  - Colonoscopy: Start at age 34 - Preventive health counseling: contraceptive needs, MVI w/folate to prevent NTD's, skin cancer prevention & skin self-exam, importance of regular dental care, alcohol moderation, cessation of tobacco use,  use of illicit drugs, healthy dietary guidelines, adequate  calcium intake (1200-'1500mg'$ /d) and vitamin D (1,000-4,000 IU/day depending on skin color), and proper exercise.     #. Migraine HA:  - Sumatriptan refilled today    #. Preconception counseling: Patient plans to start trying in about a year. Would like to wean off SSRI before then. Discussed coming off SSRI well in advance of pregnancy and ensure stability of mood prior to attempting pregnancy.   - CF screen ordered  - RTC for IUD removal and preconception visit when ready     #. Enlarging mole: On back under bra strap; increasing in size, raised and crusting; patient with significant sun exposure history from living in [place] and very fair-skinned  - Referral to Dermatology placed    Patient seen and discussed with attending Dr. Martina Sinner, who guided management plan.    Michel Bickers, MD // OBG[facility]    [phone]) Routine gynecological examination  (primary encounter diagnosis)    (G43.701) Chronic migraine without aura with status migrainosus, not intractable  Plan: SUMAtriptan Succinate 100 MG Oral Tab    (Z12.4) Pap smear for cervical cancer screening  Plan: Cervical (and Vaginal) Cancer Screening    (Z11.3) Routine screening for STI (sexually transmitted infection)  Plan: GC and Chlamydia Nucleic Acid Testing    (D22.9) Atypical mole  Plan: REFERRAL TO DERMATOLOGY    (Z31.69) Encounter for preconception consultation  Plan: CYSTIC FIBROSIS DNA SCREEN

## 2017-08-07 ENCOUNTER — Encounter (HOSPITAL_BASED_OUTPATIENT_CLINIC_OR_DEPARTMENT_OTHER): Payer: Self-pay | Admitting: Obstetrics & Gynecology

## 2017-08-07 ENCOUNTER — Ambulatory Visit: Payer: Commercial Managed Care - PPO | Attending: Obstetrics & Gynecology | Admitting: Obstetrics & Gynecology

## 2017-08-07 VITALS — BP 117/80 | HR 100 | Ht 61.0 in | Wt 134.0 lb

## 2017-08-07 DIAGNOSIS — Z124 Encounter for screening for malignant neoplasm of cervix: Secondary | ICD-10-CM | POA: Insufficient documentation

## 2017-08-07 DIAGNOSIS — Z113 Encounter for screening for infections with a predominantly sexual mode of transmission: Secondary | ICD-10-CM | POA: Insufficient documentation

## 2017-08-07 DIAGNOSIS — Z01419 Encounter for gynecological examination (general) (routine) without abnormal findings: Secondary | ICD-10-CM | POA: Insufficient documentation

## 2017-08-07 DIAGNOSIS — Z3169 Encounter for other general counseling and advice on procreation: Secondary | ICD-10-CM | POA: Insufficient documentation

## 2017-08-07 DIAGNOSIS — Z1151 Encounter for screening for human papillomavirus (HPV): Secondary | ICD-10-CM | POA: Insufficient documentation

## 2017-08-07 DIAGNOSIS — D229 Melanocytic nevi, unspecified: Secondary | ICD-10-CM | POA: Insufficient documentation

## 2017-08-07 DIAGNOSIS — G43701 Chronic migraine without aura, not intractable, with status migrainosus: Secondary | ICD-10-CM | POA: Insufficient documentation

## 2017-08-07 DIAGNOSIS — Z6825 Body mass index (BMI) 25.0-25.9, adult: Secondary | ICD-10-CM

## 2017-08-07 MED ORDER — SUMATRIPTAN SUCCINATE 100 MG OR TABS
100.0000 mg | ORAL_TABLET | ORAL | 11 refills | Status: DC | PRN
Start: 2017-08-07 — End: 2018-12-24

## 2017-08-07 NOTE — Progress Notes (Signed)
Attending Attestation  I saw and evaluated the patient. I have reviewed and edited the resident's documentation and agree with it.    Aubery Date, MD, MBA

## 2017-08-08 LAB — GC&CHLAM NUCLEIC ACID DETECTN
Chlam Trachomatis Nucleic Acid: NEGATIVE
N.Gonorrhoeae(GC) Nucleic Acid: NEGATIVE

## 2017-08-12 LAB — HPV ONLY
Cytologic Impression: NEGATIVE
HPV Presence: NEGATIVE

## 2017-08-12 LAB — CERVICAL CANCER SCREENING
Cytologic Impression: NEGATIVE
LAB AP HISTORIC HPV PRESENCE: NEGATIVE

## 2017-08-21 LAB — CYSTIC FIBROSIS DNA SCREEN: Cystic Fibrosis Results: NEGATIVE

## 2017-09-18 ENCOUNTER — Encounter (HOSPITAL_COMMUNITY): Payer: Self-pay | Admitting: *Deleted

## 2017-09-18 ENCOUNTER — Emergency Department (HOSPITAL_COMMUNITY)
Admission: EM | Admit: 2017-09-18 | Discharge: 2017-09-18 | Disposition: A | Discharge status: Home / Self Care | Payer: Medicaid Other | Attending: Emergency Medicine | Admitting: Emergency Medicine

## 2017-09-18 ENCOUNTER — Other Ambulatory Visit: Payer: Self-pay

## 2017-09-18 DIAGNOSIS — K047 Periapical abscess without sinus: Secondary | ICD-10-CM | POA: Diagnosis not present

## 2017-09-18 DIAGNOSIS — K029 Dental caries, unspecified: Secondary | ICD-10-CM | POA: Diagnosis not present

## 2017-09-18 DIAGNOSIS — Z79899 Other long term (current) drug therapy: Secondary | ICD-10-CM | POA: Insufficient documentation

## 2017-09-18 DIAGNOSIS — F17210 Nicotine dependence, cigarettes, uncomplicated: Secondary | ICD-10-CM | POA: Insufficient documentation

## 2017-09-18 DIAGNOSIS — K0889 Other specified disorders of teeth and supporting structures: Secondary | ICD-10-CM | POA: Diagnosis not present

## 2017-09-18 MED ORDER — IBUPROFEN 800 MG PO TABS
800.0000 mg | ORAL_TABLET | Freq: Once | ORAL | Status: DC
  Filled 2017-09-18: qty 1

## 2017-09-18 MED ORDER — AMOXICILLIN 250 MG PO CAPS
500.0000 mg | ORAL_CAPSULE | Freq: Once | ORAL | Status: DC
  Filled 2017-09-18: qty 2

## 2017-09-18 MED ORDER — AMOXICILLIN 500 MG PO CAPS: 500.0000 mg | ORAL_CAPSULE | Freq: Three times a day (TID) | ORAL | 0 refills | Status: AC

## 2017-09-18 MED ORDER — IBUPROFEN 600 MG PO TABS: 600.0000 mg | ORAL_TABLET | Freq: Four times a day (QID) | ORAL | 0 refills | Status: DC | PRN

## 2017-09-18 NOTE — ED Notes (Signed)
Bad teeth for awhile per pt Followed at [facility]- went there but they are closed today  Here for eval of dental pain

## 2017-09-18 NOTE — Discharge Instructions (Addendum)
Complete your entire course of antibiotics as prescribed.    Avoid applying heat or ice to this abscess area which can worsen your symptoms.  You may use warm salt water swish and spit treatment or half peroxide and water swish and spit after meals to keep this area clean as discussed.  Call the dentist listed above for further management of your symptoms.  

## 2017-09-18 NOTE — ED Triage Notes (Signed)
Pt with dental pain for a day, states dentist is closed today and unable to make an appt.

## 2017-09-18 NOTE — ED Provider Notes (Signed)
[facility] EMERGENCY DEPARTMENT Provider Note   CSN: [id number] Arrival date & time: 09/18/17  1554     History   Chief Complaint Chief Complaint  Patient presents with  . Dental Pain    HPI Jennifer Pollard is a 32 y.o. female who was followed by the dental clinic [facility] [place] presenting with a 1 day history of pain in her left lower third molar in association with gingival edema.  She has had chronic decay in this tooth but has had no problems with pain or infection until this episode.  She has taken Tylenol without relief of her symptoms.  She denies fevers or chills and is able to tolerate eating, although avoiding chewing on her left side.    The history is provided by the patient.    History reviewed. No pertinent past medical history.  There are no active problems to display for this patient.   History reviewed. No pertinent surgical history.   OB History   None      Home Medications    Prior to Admission medications   Medication Sig Start Date End Date Taking? Authorizing Provider  acetaminophen (TYLENOL) 500 MG tablet Take 1,000 mg by mouth every 6 (six) hours as needed for moderate pain.    [provider]  amoxicillin (AMOXIL) 500 MG capsule Take 1 capsule (500 mg total) by mouth 3 (three) times daily for 10 days. 09/18/17 09/28/17  Evalee Jefferson, PA-C  diclofenac (VOLTAREN) 50 MG EC tablet Take 1 tablet (50 mg total) by mouth 2 (two) times daily. 07/25/15   Fransico Meadow, PA-C  ibuprofen (ADVIL,MOTRIN) 600 MG tablet Take 1 tablet (600 mg total) by mouth every 6 (six) hours as needed. 09/18/17   Evalee Jefferson, PA-C  traMADol Veatrice Bourbon) 50 MG tablet 1 or 2 po q6h prn pain 08/29/14   Lily Kocher, PA-C    Family History History reviewed. No pertinent family history.  Social History Social History   Tobacco Use  . Smoking status: Current Some Day Smoker    Packs/day: 0.50    Types: Cigars  . Smokeless tobacco: Never Used    Substance Use Topics  . Alcohol use: No  . Drug use: No     Allergies   Patient has no known allergies.   Review of Systems Review of Systems  Constitutional: Negative for fever.  HENT: Positive for dental problem. Negative for facial swelling and sore throat.   Respiratory: Negative for shortness of breath.   Musculoskeletal: Negative for neck pain and neck stiffness.     Physical Exam Updated Vital Signs BP (!) 138/99   Pulse 100   Temp 99.2 F (37.3 C)   Resp 18   Ht 5\' 4"  (1.626 m)   Wt 88.9 kg (196 lb)   SpO2 100%   BMI 33.64 kg/m   Physical Exam  Constitutional: She is oriented to person, place, and time. She appears well-developed and well-nourished. No distress.  HENT:  Head: Normocephalic and atraumatic.  Right Ear: Tympanic membrane and external ear normal.  Left Ear: Tympanic membrane and external ear normal.  Mouth/Throat: Oropharynx is clear and moist and mucous membranes are normal. No oral lesions. No trismus in the jaw. Dental caries present. No dental abscesses.    Sublingual space is soft.  Eyes: Conjunctivae are normal.  Neck: Normal range of motion. Neck supple.  Cardiovascular: Normal rate and normal heart sounds.  Pulmonary/Chest: Effort normal.  Musculoskeletal: Normal range of  motion.  Lymphadenopathy:    She has no cervical adenopathy.  Neurological: She is alert and oriented to person, place, and time.  Skin: Skin is warm and dry. No erythema.  Psychiatric: She has a normal mood and affect.     ED Treatments / Results  Labs (all labs ordered are listed, but only abnormal results are displayed) Labs Reviewed - No data to display  EKG None  Radiology No results found.  Procedures Procedures (including critical care time)  Medications Ordered in ED Medications  ibuprofen (ADVIL,MOTRIN) tablet 800 mg (has no administration in time range)  amoxicillin (AMOXIL) capsule 500 mg (has no administration in time range)      Initial Impression / Assessment and Plan / ED Course  I have reviewed the triage vital signs and the nursing notes.  Pertinent labs & imaging results that were available during my care of the patient were reviewed by me and considered in my medical decision making (see chart for details).     Patient was placed on amoxicillin and prescription strength ibuprofen.  She will call her dentist tomorrow, although advised she delay her appointment time until the antibiotics have had a chance to improve this infection.  Patient agrees with and understands plan.  No exam findings suggesting blood weeks angina or abscess.  Final Clinical Impressions(s) / ED Diagnoses   Final diagnoses:  Dental infection    ED Discharge Orders        Ordered    amoxicillin (AMOXIL) 500 MG capsule  3 times daily     09/18/17 1646    ibuprofen (ADVIL,MOTRIN) 600 MG tablet  Every 6 hours PRN     09/18/17 1646       Evalee Jefferson, PA-C 09/18/17 1759    Milton Ferguson, MD 09/18/17 2322

## 2017-12-16 NOTE — Progress Notes (Deleted)
[facility] HEADACHE CLINIC FOLLOW-UP VISIT    IDENTIFICATION DATA/CHIEF COMPLAINT:  Erica [name] is a very pleasant 32 year old who was first seen in clinic 12/03/2015 by Dr. Geanie Kenning for initial consult visit.   - 12/03/2015 - Initial visit (by Dr. Geanie Kenning)  - 12/24/2016 - Botox #1 (by Clayton Lefort ARNP)  - 04/02/2016 - Botox #2 - after treatment she had 3 weeks without migraines (by Clayton Lefort ARNP)  - 04/17/2016 - Follow up visit (by Dr. Geanie Kenning)    - 06/26/16 - Botox #3 - Mild  "droopy" eyelids after botox # 2 - injected higher up in frontalis with no further side effect  - 06/26/16 - Follow up visit (by Dr. Geanie Kenning)   - 09/23/2016 - Botox #4 (by Clayton Lefort ARNP)  - 01/28/2017 - Botox #5 (by Dorrene German PA-C)   - 12/18/2017 - LV one year ago - Follow up visit and Cymbalta taper (by Dorrene German PA-C) Today    Erica [name] first presented to the clinic on 12/03/2015, evaluated by Dr. Geanie Kenning.  Erica [name] is a pleasant 32 year old female with diagnosis of (G43.701) Chronic migraine without aura with status migrainosus, not intractable  (primary encounter diagnosis).  Currently has near-daily mild headache, with migraine about once a week, has had up to 3 [name] or more per week of migraine in the past. She is doing a great job with lifestyle management including exercise and diet.       Erica [name] has a diagnosis of (G43.701) Chronic migraine without aura with status migrainosus, not intractable  (primary encounter diagnosis)    Payor: [facility] [facility] [place] [facility] / Plan: [employer] [facility] [place] OPTIONS ACCESS PPO / Product Type: PPO    ========================================    BMI: 25.32 / *** (today)  What is your goal of this visit? Follow up and Cymbalta taper   How are you doing overall? Better/same/worse ***    Steps-to-Success shared visit: NO  Biofeedback shared visit: NO  Nutrition shared visit: NO  Sleep shared visit: NO    Cefaly for neuromodulation  - attended training: ***  - currently using:  ***    GammaCore Sapphire for vagal nerve stimulation:   - attended training: ***  - currently using: ***    ========================================    CURRENT MEDICATION LIST PER TODAY 12/18/2017:    ACUTE MEDICATIONS   Currently taking:  -   Tried in the past:   -    [place]  Currently taking:  -   Tried in the past:  -     THERAPIES TRIED   -      SUPPLEMENTS:  Currently taking:  -   Tried in the past:  -       [place]   Erica [name] is a very pleasant 32 year old years old female here today for follow up visit.   She reports the following features of her headaches:    - FREQUENCY (HA [name]/month): *** in the last 30 [name] - in Aug/Sept (LV: *** a month)  - AVERAGE INTENSITY: *** in the last 30 [name] - in Aug/Sept (LV: 3/10)  - DURATION: *** in the last 30 [name] - in Aug/Sept (LV: ***)     She also had *** migraine [name] in the last 30 [name] - in Aug/Sept (LV: *** [name] of migraine).      From LV notes on 01/28/2017 (one year ago):  Overall  headaches much improved with Botox treatment, one headache every other week.   She will go to [place] where she will meet her mom and her fiance from [place].    Headaches more on the right side, especially in the back.   Light and sound sensitivity, nausea, muscle tension in neck and shoulders  Massage therapy helpful    She shows interest in vagal nerve stimulation, she will make a future appt for a [employer].     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        Review of Systems (ROS)  General: No fever, no chills, no unintentional weight Loss, no weight gain, no night sweats, no fatigue, no weakness.  Neurologic: Positive for headache. Denies memory difficulty, changes in vision, trouble concentrating, parasthesias    Outpatient Medications Prior to Visit   Medication Sig Dispense Refill    Cholecalciferol (VITAMIN D3) 1000  units Oral Tab Take 1,000 Units by mouth daily.      DULoxetine HCl 60 MG Oral CAPSULE ENTERIC COATED PARTICLES Take 1 capsule (60 mg) by mouth daily. 30 capsule 11    Ketorolac Tromethamine 10 MG Oral Tab Take 1 tablet (10 mg) by mouth every 4 hours as needed for pain. 20 tablet 11    Melatonin 5 MG Oral Tab Take by mouth at bedtime.      Naproxen 500 MG Oral Tab Take 550 mg by mouth daily as needed.       Ondansetron HCl 8 MG Oral Tab Take 1 tablet (8 mg) by mouth every 8 hours as needed for nausea/vomiting. 12 tablet 11    SUMAtriptan Succinate 100 MG Oral Tab Take 1 tablet (100 mg) by mouth every 2 hours as needed for migraines. Take at onset of migraine. May repeat x 1 dose. Max 200 mg/24 hours 9 tablet 11    Unclassified (OTHER MEDS, SEE COMMENTS,) Cefaly device, use as directed (Patient not taking: Reported on 06/26/2016) 1 kit 0     No facility-administered medications prior to visit.        Allergies, Past Medical History, Problem List, Family history and Social history reviewed per EPIC.     Physical Exam:  There were no vitals taken for this visit.  General: No acute distress, well-nourished, well-groomed, bright affect  Head: Normocephalic, atraumatic, normal hair distribution  Eyes: Sclera white, anicteric, conjunctiva clear  Neuro: Balanced gait, oriented x3, follows conversation easily    Diagnostic Studies/Lab:  None this visit    ========================================    ASSESSMENT AND PLAN:  Erica [name] is a very pleasant 32 year old with diagnosis of (G43.701) Chronic migraine without aura with status migrainosus, not intractable  (primary encounter diagnosis)    CHANGES OF TODAY:    1.       ====================    We continue to recommend a multimodal approach to headache management. Your participation in your care is immensely important and we are excited about the work you have done so far to improve your overall health and headache symptoms.     Acute (abortive) medications:  these medications are to be taken only at the onset of severe headache. Please limit the total of any combination of these medications to less than 6 [name] per month on average because they can cause rebound (medication-use) headaches.     Natural approaches: to increasing brain energy and serotonin:   SAMe 200 mg  a day   Vitamin B2 400 mg daily   Vitamin B12 1000 mcg daily   Getting early morning light to increase natural serotonin, melatonin. Could also consider light therapy box (happy lamp) - 10,000 LUX.     Supplements over-the counter for prevention and acute treatment of migraine   Boswellia 800 mg 2-3 times per day, example brand Swanson's, natural anti inflammatory herb   Sleep modulation- melatonin at night 5 to 10 mg 30 minutes prior to sleep   Feverfew Tea 1-3 cups per day. Can get from buddhateas.com or tenzingmomo.com- A Natural anti inflammatory approach that does not cause further sensitization of the system.   Magnesium Citrate 250 mg daily, slowly increase up to [phone] mg daily. Side effect may include diarrhea, so we recommend stopping at whatever dose is comfortable for your body without causing this side effect. This supplement can be very helpful for preventing headache, preventing migraine aura, calming nerves, muscles and even mood. Recommend starting slowly, as it can also lead to increased bowel movements or diarrhea.     Follow up with Dorrene German PA-C in *** weeks or sooner if any new problems related to headache management arise.     General recommendations for lifestyle management of headaches:     Exercise   Exercise routine should include both:  o Aerobic - like walking, swimming, jogging, biking, elliptical  o Anaerobic (muscle building) - like yoga, Pilates, Tai Chi, light weights or resistance bands   Stay hydrated before and after exercise   Have a healthy snack after exercise even if you do not feel hungry    Nutrition   Home made bone broth   Increase the  protein intake in the morning (meat, dairy, soy, nuts, beans)    Avoid foods with high glycemic index (soda and juice, sugars and sweeteners, processed carbohydrates, see www.glycemicindex.com to look up the foods you typically eat)   Limit caffeine to 1-2 cups of coffee or tea per day   Book recommendation: The [address], by Margarito Liner is a good guideline to anti-inflammatory diet   Consider attending the nutrition class with Tiajuana Amass, DNP to learn more about nutrition for headache prevention     Sleep   Establish a regular bedtime and awakening time, even on nights where you didn't sleep well   Keep daytime naps to one hour or less   If you regularly use a computer, tablet, or smartphone in the evening, download a filter program that will dim the screen and filter out the blue light to prevent this from interfering with your sleep cycle (examples: Twilight app on Android phone, Night Shift on iPhone, F.lux on Windows computer)   Melatonin 3-10 mg (or Melatonin with L-theanene) taken 30 minutes before bedtime helps prepare the brain for sleep and can help prevent migraines as well   Normalize the day/night rhythm of the brain by getting outside in the morning for natural sunlight exposure, or use a sun lamp 10,000 LUX for at least 15-30 minutes first thing in the morning (can be bought on [employer] for ~$40)   Consider attending the sleep class with Clayton Lefort, ARNP, to learn about strategies for improving your sleep quality     Here are a few tips to improve your healthcare:     Please fill out your RED CAP survey prior to each visit.      Refills: Call your pharmacy at least 7 working [name] before you run out. Please do not  call the clinic for refills, it is quicker and safer to go through your pharmacy. We usually prescribe the medication for one year. If you continue to receive the prescription from [place], we require at least one yearly visit for refills. Please schedule your appointment  one month prior to running out.      Test Results: Available in 1-2 weeks on eCare. We will contact you sooner by telephone if there is something more urgent.      Urgent Symptoms: Call [phone], day or night, and select option 8. Our clinic staff will help you during regular hours; after hours, [facility] on-call nurses will help you. If life-threatening, please call 911.       Other Questions: Use eCare to securely e-mail [place]. If you have a long or complex question or a new issue, please make an appointment, call [phone], day or night, and select option 8. To sign up for eCare call [phone] or ask your medical assistant to sign you up when in clinic.       Headache Clinic Policies:     - You need be seen at least once a year to continue safely receiving prescriptions from [place].  - We do not coordinate disability or social security, however we can provide our note to your primary care provider to help you coordinate the process.  - We can assist with family medical leave paperwork (FMLA); filling out the paperwork requires a face-to-face office visit.   - Schedule appointments ahead of time; we do not have urgent, same-day appointments available.  - We do not prescribe opioids or other controlled substances aside from Lyrica.  - To assure best access to care for all of our patients, we require at least 48 hours notice prior to cancelling appointments.      Benefits, side effects, and alternatives were discussed.   The patient understands all the recommendations.     The patient is agreeable with the plan.      I spent over  {ok to personalize single select:102991::"15 minutes","20 minutes","30 minutes","45 minutes","60 minutes","90 minutes"} face-to-face with the patient. More than 50% of this time was spent in counseling regarding the diagnosis and treatment options.    Dorrene German, PA-C, [facility]

## 2017-12-18 ENCOUNTER — Encounter (HOSPITAL_BASED_OUTPATIENT_CLINIC_OR_DEPARTMENT_OTHER): Payer: Commercial Managed Care - PPO | Admitting: Physician Assistant

## 2017-12-21 ENCOUNTER — Encounter (HOSPITAL_BASED_OUTPATIENT_CLINIC_OR_DEPARTMENT_OTHER): Payer: Self-pay | Admitting: Physician Assistant

## 2017-12-21 ENCOUNTER — Ambulatory Visit: Payer: Commercial Managed Care - PPO | Attending: Physician Assistant | Admitting: Physician Assistant

## 2017-12-21 VITALS — BP 115/79 | HR 103 | Ht 60.98 in | Wt 140.2 lb

## 2017-12-21 DIAGNOSIS — G43701 Chronic migraine without aura, not intractable, with status migrainosus: Secondary | ICD-10-CM | POA: Insufficient documentation

## 2017-12-21 MED ORDER — SUMATRIPTAN SUCCINATE REFILL 6 MG/0.5ML SC SOCT
6.0000 mg | SUBCUTANEOUS | 2 refills | Status: AC | PRN
Start: 2017-12-21 — End: 2017-12-29

## 2017-12-21 MED ORDER — DULOXETINE HCL 20 MG OR CPEP
20.0000 mg | DELAYED_RELEASE_CAPSULE | Freq: Two times a day (BID) | ORAL | 11 refills | Status: DC
Start: 2017-12-21 — End: 2018-12-24

## 2017-12-21 NOTE — Patient Instructions (Signed)
ASSESSMENT AND PLAN:  Erica [name] is a very pleasant 32 year old with diagnosis of (G43.701) Chronic migraine without aura with status migrainosus, not intractable  (primary encounter diagnosis)    CHANGES OF TODAY:    1. Congratulations for doing so well, with only 4-5 days of headaches a month.     2. I agree that you can treat your flare up headaches with Sumatriptan tablet, please do not exceed 2 days a week or 6 days a month because it could increase the risk for rebound headache. Take the same caution for any combination of acute medications such as ibuprofen, Excedrin, aspirin, tylenol, opioids or any other triptan.     3. You may continue taking duloxetine 60 mg daily for prevention of migraine. If you want to try taparing this medication, you can continue taking only 40 mg daily for the next 2-3 weeks. Then you may continue with only 30 mg daily for 2-3 weeks, then to 20 mg daily for 2-3 weeks, then you can stop.   Today I signed a prescription for Cymbalta 20 mg twice daily, 11 refills.     4. Continue taking recommended supplements to support the brain function:   SAMe 200 mg a day   Vitamin B2 400 mg daily   Vitamin B12 1000 mcg daily   Getting early morning light to increase natural serotonin, melatonin. Could also consider light therapy box (happy lamp) - 10,000 LUX.   Boswellia 800 mg 2-3 times per day, example brand Swanson's, natural anti inflammatory herb   Sleep modulation- melatonin at night 5 to 10 mg 30 minutes prior to sleep   Feverfew Tea 1-3 cups per day. Can get from buddhateas.com or tenzingmomo.com- A Natural anti inflammatory approach that does not cause further sensitization of the system.   Magnesium citrate or chelated magnesium 250 mg daily, slowly increase up to [phone] mg daily. Side effect may include diarrhea, so we recommend stopping at whatever dose is comfortable for your body without causing this side effect. This supplement can be very helpful for  preventing headache, preventing migraine aura, calming nerves, muscles and even mood. Recommend starting slowly, as it can also lead to increased bowel movements or diarrhea.    CoQ10 100-200 mg daily for more brain energy    Ginger 400 mg daily     5. Remember to use ice packs and/or migraine hat for flare up headaches.     6. Please make separated appointments for a demonstration of medical devices for migraine (Cefaly and GammaCore)    ====================    We continue to recommend a multimodal approach to headache management. Your participation in your care is immensely important and we are excited about the work you have done so far to improve your overall health and headache symptoms.     Acute (abortive) medications: these medications are to be taken only at the onset of severe headache. Please limit the total of any combination of these medications to less than 6 days per month on average because they can cause rebound (medication-use) headaches.     Natural approaches: to increasing brain energy and serotonin:   SAMe 200 mg a day   Vitamin B2 400 mg daily   Vitamin B12 1000 mcg daily   Getting early morning light to increase natural serotonin, melatonin. Could also consider light therapy box (happy lamp) - 10,000 LUX.     Supplements over-the counter for prevention and acute treatment of migraine   Boswellia 800  mg 2-3 times per day, example brand Swanson's, natural anti inflammatory herb   Sleep modulation- melatonin at night 5 to 10 mg 30 minutes prior to sleep   Feverfew Tea 1-3 cups per day. Can get from buddhateas.com or tenzingmomo.com- A Natural anti inflammatory approach that does not cause further sensitization of the system.   Magnesium Citrate 250 mg daily, slowly increase up to [phone] mg daily. Side effect may include diarrhea, so we recommend stopping at whatever dose is comfortable for your body without causing this side effect. This supplement can be very helpful for  preventing headache, preventing migraine aura, calming nerves, muscles and even mood. Recommend starting slowly, as it can also lead to increased bowel movements or diarrhea.     Follow up with Dorrene German PA-C in 4 weeks or sooner if any new problems related to headache management arise.     General recommendations for lifestyle management of headaches:     Exercise   Exercise routine should include both:  o Aerobic - like walking, swimming, jogging, biking, elliptical  o Anaerobic (muscle building) - like yoga, Pilates, Tai Chi, light weights or resistance bands   Stay hydrated before and after exercise   Have a healthy snack after exercise even if you do not feel hungry    Nutrition   Home made bone broth   Increase the protein intake in the morning (meat, dairy, soy, nuts, beans)    Avoid foods with high glycemic index (soda and juice, sugars and sweeteners, processed carbohydrates, see www.glycemicindex.com to look up the foods you typically eat)   Limit caffeine to 1-2 cups of coffee or tea per day   Book recommendation: The Ab[address] Way, by Margarito Liner is a good guideline to anti-inflammatory diet   Consider attending the nutrition class with Tiajuana Amass, DNP to learn more about nutrition for headache prevention     Sleep   Establish a regular bedtime and awakening time, even on nights where you didn't sleep well   Keep daytime naps to one hour or less   If you regularly use a computer, tablet, or smartphone in the evening, download a filter program that will dim the screen and filter out the blue light to prevent this from interfering with your sleep cycle (examples: Twilight app on Android phone, Night Shift on iPhone, F.lux on Windows computer)   Melatonin 3-10 mg (or Melatonin with L-theanene) taken 30 minutes before bedtime helps prepare the brain for sleep and can help prevent migraines as well   Normalize the day/night rhythm of the brain by getting outside in the morning for  natural sunlight exposure, or use a sun lamp 10,000 LUX for at least 15-30 minutes first thing in the morning (can be bought on [employer] for ~$40)   Consider attending the sleep class with Clayton Lefort, ARNP, to learn about strategies for improving your sleep quality     Here are a few tips to improve your healthcare:     Please fill out your RED CAP survey prior to each visit.      Refills: Call your pharmacy at least 7 working days before you run out. Please do not call the clinic for refills, it is quicker and safer to go through your pharmacy. We usually prescribe the medication for one year. If you continue to receive the prescription from [place], we require at least one yearly visit for refills. Please schedule your appointment one month prior to running out.  Test Results: Available in 1-2 weeks on eCare. We will contact you sooner by telephone if there is something more urgent.      Urgent Symptoms: Call [phone], day or night, and select option 8. Our clinic staff will help you during regular hours; after hours, [facility] on-call nurses will help you. If life-threatening, please call 911.       Other Questions: Use eCare to securely e-mail [place]. If you have a long or complex question or a new issue, please make an appointment, call [phone], day or night, and select option 8. To sign up for eCare call [phone] or ask your medical assistant to sign you up when in clinic.       Headache Clinic Policies:     - You need be seen at least once a year to continue safely receiving prescriptions from [place].  - We do not coordinate disability or social security, however we can provide our note to your primary care provider to help you coordinate the process.  - We can assist with family medical leave paperwork (FMLA); filling out the paperwork requires a face-to-face office visit.   - Schedule appointments ahead of time; we do not have urgent, same-day appointments available.  - We do not  prescribe opioids or other controlled substances aside from Lyrica.  - To assure best access to care for all of our patients, we require at least 48 hours notice prior to cancelling appointments.

## 2017-12-21 NOTE — Progress Notes (Signed)
[facility] HEADACHE CLINIC FOLLOW-UP VISIT    IDENTIFICATION DATA/CHIEF COMPLAINT:  Erica [name] is a very pleasant 32 year old who was first seen in clinic 12/03/2015 by Dr. Geanie Kenning for initial consult visit.   - 12/03/2015 - Initial visit (by Dr. Geanie Kenning)  - 12/24/2016 - Botox #1 (by Clayton Lefort ARNP)  - 04/02/2016 - Botox #2 - after treatment she had 3 weeks without migraines (by Clayton Lefort ARNP)  - 04/17/2016 - Follow up visit (by Dr. Geanie Kenning)    - 06/26/16 - Botox #3 - Mild  "droopy" eyelids after botox # 2 - injected higher up in frontalis with no further side effect  - 06/26/16 - Follow up visit (by Dr. Geanie Kenning)   - 09/23/2016 - Botox #4 (by Clayton Lefort ARNP)  - 01/28/2017 - Botox #5 (by Dorrene German PA-C)   - 12/21/2017 - LV one year ago - Follow up visit and Cymbalta taper (by Dorrene German PA-C) Today    Erica [name] first presented to the clinic on 12/03/2015, evaluated by Dr. Geanie Kenning.  Celisa is a pleasant 32 year old female with diagnosis of (G43.701) Chronic migraine without aura with status migrainosus, not intractable  (primary encounter diagnosis).  Currently has near-daily mild headache, with migraine about once a week, has had up to 3 [name] or more per week of migraine in the past. She is doing a great job with lifestyle management including exercise and diet.       Erica [name] has a diagnosis of (G43.701) Chronic migraine without aura with status migrainosus, not intractable  (primary encounter diagnosis)    Payor: [facility] [place] [facility] / Plan: [facility] [place] OPT[employer] ACCESS PPO / Product Type: PPO    ========================================    BMI: 25.32 / 26.50 (today)  What is your goal of this visit? Follow up and Cymbalta taper   How are you doing overall? Definitely better     Steps-to-Success shared visit: NO  Biofeedback shared visit: NO  Nutrition shared visit: NO  Sleep shared visit: NO    Cefaly for neuromodulation  - attended training: NO  - currently using:  NO    GammaCore Sapphire for vagal nerve stimulation:   - attended training: NO  - currently using: NO    ========================================    CURRENT MEDICATION LIST PER TODAY 12/21/2017:    ACUTE MEDICATIONS   Currently taking:  - Sumatriptan tablet 4 [name] in Sept.   - Ibuprofen 2 [name] in Sept.   - Canabis smoke, or tincture or vappe - every day     Tried in the past:   - Ketorolac - she ran out 6 months ago, when she last took it.     PREVENTIVE MEDICATIONS FOR HEADACHE  Currently taking:  - Duloxetine 60 mg daily, for the last 1-2 years - she would like to get off this medication, thinking about getting pregnant in 1-2 years from now.     SUPPLEMENTS:  Currently taking:  - melatonin 3 mg at night   - Vitamin D3 3,000 IU      HISTORY OF PRESENT ILLNESS   Erica [name] is a very pleasant 32 year old years old female here today for follow up visit.   She reports the following features of her headaches:    - FREQUENCY (HA [name]/month): 4-5 [name] in the last 30 [name] - in Sept.  - AVERAGE INTENSITY: 5-6/10 in the last 30 [name] -  in Sept.  -  DURATION: 3-4 hours the HA itself, but including the prodrome and postdrome it will last up to all day.      She also had 1 migraine day 8-9/10 in the last 30 [name] - in Sept.  She describes the worse migraine waking her up in the morning.      From LV notes on 01/28/2017 (one year ago):  Overall headaches much improved with Botox treatment, one headache every other week.   She will go to [place] where she will meet her mom and her fiance from [place].    Headaches more on the right side, especially in the back.   Light and sound sensitivity, nausea, muscle tension in neck and shoulders  Massage therapy helpful    She shows interest in vagal nerve stimulation, she will make a future appt for a [employer].     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        Review of  Systems (ROS)  General: No fever, no chills, no unintentional weight Loss, no weight gain, no night sweats, no fatigue, no weakness.  Neurologic: Positive for headache. Denies memory difficulty, changes in vision, trouble concentrating, parasthesias    Outpatient Medications Prior to Visit   Medication Sig Dispense Refill    Cholecalciferol (VITAMIN D3) 1000 units Oral Tab Take 1,000 Units by mouth daily.      DULoxetine HCl 60 MG Oral CAPSULE ENTERIC COATED PARTICLES Take 1 capsule (60 mg) by mouth daily. 30 capsule 11    Ketorolac Tromethamine 10 MG Oral Tab Take 1 tablet (10 mg) by mouth every 4 hours as needed for pain. 20 tablet 11    Melatonin 5 MG Oral Tab Take by mouth at bedtime.      Naproxen 500 MG Oral Tab Take 550 mg by mouth daily as needed.       Ondansetron HCl 8 MG Oral Tab Take 1 tablet (8 mg) by mouth every 8 hours as needed for nausea/vomiting. 12 tablet 11    SUMAtriptan Succinate 100 MG Oral Tab Take 1 tablet (100 mg) by mouth every 2 hours as needed for migraines. Take at onset of migraine. May repeat x 1 dose. Max 200 mg/24 hours 9 tablet 11    Unclassified (OTHER MEDS, SEE COMMENTS,) Cefaly device, use as directed (Patient not taking: Reported on 06/26/2016) 1 kit 0     No facility-administered medications prior to visit.        Allergies, Past Medical History, Problem List, Family history and Social history reviewed per EPIC.     Physical Exam:  BP 115/79    Pulse (!) 103    Ht 5' 0.98" (1.549 m)    Wt 140 lb 3.2 oz (63.6 kg)    BMI 26.50 kg/m   General: No acute distress, well-nourished, well-groomed, bright affect  Head: Normocephalic, atraumatic, normal hair distribution  Eyes: Sclera white, anicteric, conjunctiva clear  Neuro: Balanced gait, oriented x3, follows conversation easily    Diagnostic Studies/Lab:  None this visit    ========================================     ASSESSMENT AND PLAN:  Erica [name] is a very pleasant 32 year old with diagnosis of (G43.701)  Chronic migraine without aura with status migrainosus, not intractable  (primary encounter diagnosis)    CHANGES OF TODAY:    1. Congratulations for doing so well, with only 4-5 [name] of headaches a month.     2. I  agree that you can treat your flare up headaches with Sumatriptan tablet, please do not exceed 2 [name] a week or 6 [name] a month because it could increase the risk for rebound headache. Take the same caution for any combination of acute medications such as ibuprofen, Excedrin, aspirin, tylenol, opioids or any other triptan.     3. You may continue taking duloxetine 60 mg daily for prevention of migraine. If you want to try taparing this medication, you can continue taking only 40 mg daily for the next 2-3 weeks. Then you may continue with only 30 mg daily for 2-3 weeks, then to 20 mg daily for 2-3 weeks, then you can stop.   Today I signed a prescription for Cymbalta 20 mg twice daily, 11 refills.     4. Continue taking recommended supplements to support the brain function:   SAMe 200 mg a day   Vitamin B2 400 mg daily   Vitamin B12 1000 mcg daily   Getting early morning light to increase natural serotonin, melatonin. Could also consider light therapy box (happy lamp) - 10,000 LUX.   Boswellia 800 mg 2-3 times per day, example brand Swanson's, natural anti inflammatory herb   Sleep modulation- melatonin at night 5 to 10 mg 30 minutes prior to sleep   Feverfew Tea 1-3 cups per day. Can get from buddhateas.com or tenzingmomo.com- A Natural anti inflammatory approach that does not cause further sensitization of the system.   Magnesium citrate or chelated magnesium 250 mg daily, slowly increase up to [phone] mg daily. Side effect may include diarrhea, so we recommend stopping at whatever dose is comfortable for your body without causing this side effect. This supplement can be very helpful for preventing headache, preventing migraine aura, calming nerves, muscles and even mood. Recommend starting  slowly, as it can also lead to increased bowel movements or diarrhea.    CoQ10 100-200 mg daily for more brain energy    Ginger 400 mg daily     5. Remember to use ice packs and/or migraine hat for flare up headaches.     6. Please make separated appointments for a demonstration of medical devices for migraine (Cefaly and GammaCore)    ====================    We continue to recommend a multimodal approach to headache management. Your participation in your care is immensely important and we are excited about the work you have done so far to improve your overall health and headache symptoms.     Acute (abortive) medications: these medications are to be taken only at the onset of severe headache. Please limit the total of any combination of these medications to less than 6 [name] per month on average because they can cause rebound (medication-use) headaches.     Natural approaches: to increasing brain energy and serotonin:   SAMe 200 mg a day   Vitamin B2 400 mg daily   Vitamin B12 1000 mcg daily   Getting early morning light to increase natural serotonin, melatonin. Could also consider light therapy box (happy lamp) - 10,000 LUX.     Supplements over-the counter for prevention and acute treatment of migraine   Boswellia 800 mg 2-3 times per day, example brand Swanson's, natural anti inflammatory herb   Sleep modulation- melatonin at night 5 to 10 mg 30 minutes prior to sleep   Feverfew Tea 1-3 cups per day. Can get from buddhateas.com or tenzingmomo.com- A Natural anti inflammatory approach that does not cause further sensitization of the system.   Magnesium Citrate  250 mg daily, slowly increase up to [phone] mg daily. Side effect may include diarrhea, so we recommend stopping at whatever dose is comfortable for your body without causing this side effect. This supplement can be very helpful for preventing headache, preventing migraine aura, calming nerves, muscles and even mood. Recommend starting slowly,  as it can also lead to increased bowel movements or diarrhea.     Follow up with Dorrene German PA-C in 4 weeks or sooner if any new problems related to headache management arise.     General recommendations for lifestyle management of headaches:     Exercise   Exercise routine should include both:  o Aerobic - like walking, swimming, jogging, biking, elliptical  o Anaerobic (muscle building) - like yoga, Pilates, Tai Chi, light weights or resistance bands   Stay hydrated before and after exercise   Have a healthy snack after exercise even if you do not feel hungry    Nutrition   Home made bone broth   Increase the protein intake in the morning (meat, dairy, soy, nuts, beans)    Avoid foods with high glycemic index (soda and juice, sugars and sweeteners, processed carbohydrates, see www.glycemicindex.com to look up the foods you typically eat)   Limit caffeine to 1-2 cups of coffee or tea per day   Book recommendation: The [address], by Margarito Liner is a good guideline to anti-inflammatory diet   Consider attending the nutrition class with Tiajuana Amass, DNP to learn more about nutrition for headache prevention     Sleep   Establish a regular bedtime and awakening time, even on nights where you didn't sleep well   Keep daytime naps to one hour or less   If you regularly use a computer, tablet, or smartphone in the evening, download a filter program that will dim the screen and filter out the blue light to prevent this from interfering with your sleep cycle (examples: Twilight app on Android phone, Night Shift on iPhone, F.lux on Windows computer)   Melatonin 3-10 mg (or Melatonin with L-theanene) taken 30 minutes before bedtime helps prepare the brain for sleep and can help prevent migraines as well   Normalize the day/night rhythm of the brain by getting outside in the morning for natural sunlight exposure, or use a sun lamp 10,000 LUX for at least 15-30 minutes first thing in the morning (can be  bought on [employer]com for ~$40)   Consider attending the sleep class with Clayton Lefort, ARNP, to learn about strategies for improving your sleep quality     Here are a few tips to improve your healthcare:     Please fill out your RED CAP survey prior to each visit.      Refills: Call your pharmacy at least 7 working [name] before you run out. Please do not call the clinic for refills, it is quicker and safer to go through your pharmacy. We usually prescribe the medication for one year. If you continue to receive the prescription from [place], we require at least one yearly visit for refills. Please schedule your appointment one month prior to running out.      Test Results: Available in 1-2 weeks on eCare. We will contact you sooner by telephone if there is something more urgent.      Urgent Symptoms: Call [phone], day or night, and select option 8. Our clinic staff will help you during regular hours; after hours, [place] [facility] [place] on-call nurses will help you. If life-threatening, please  call 911.       Other Questions: Use eCare to securely e-mail [place]. If you have a long or complex question or a new issue, please make an appointment, call [phone], day or night, and select option 8. To sign up for eCare call [phone] or ask your medical assistant to sign you up when in clinic.       Headache Clinic Policies:     - You need be seen at least once a year to continue safely receiving prescriptions from [place].  - We do not coordinate disability or social security, however we can provide our note to your primary care provider to help you coordinate the process.  - We can assist with family medical leave paperwork (FMLA); filling out the paperwork requires a face-to-face office visit.   - Schedule appointments ahead of time; we do not have urgent, same-day appointments available.  - We do not prescribe opioids or other controlled substances aside from Lyrica.  - To assure best access to care for all of our  patients, we require at least 48 hours notice prior to cancelling appointments.      Benefits, side effects, and alternatives were discussed.   The patient understands all the recommendations.     The patient is agreeable with the plan.      I spent over  30 minutes face-to-face with the patient. More than 50% of this time was spent in counseling regarding the diagnosis and treatment options.    Dorrene German, PA-C, [facility]

## 2017-12-30 ENCOUNTER — Other Ambulatory Visit (HOSPITAL_BASED_OUTPATIENT_CLINIC_OR_DEPARTMENT_OTHER): Payer: Self-pay | Admitting: Physician Assistant

## 2017-12-30 DIAGNOSIS — G43701 Chronic migraine without aura, not intractable, with status migrainosus: Secondary | ICD-10-CM

## 2017-12-30 NOTE — Telephone Encounter (Signed)
LV on 12/21/2017 we discussed about Sumatriptan tablets and I signed a Rx for pills of 100 mg, not the injectable one. I will not sign this rx for injectable today to avoid double prescription for the same medication.   Thank you,   Dorrene German PA-C

## 2017-12-30 NOTE — Telephone Encounter (Signed)
This medication is outside of the Refill Center's protocols.  If this medication is denied please have your staff inform the patient and schedule an appointment if necessary.

## 2018-01-16 ENCOUNTER — Other Ambulatory Visit: Payer: Self-pay

## 2018-01-16 ENCOUNTER — Emergency Department (HOSPITAL_COMMUNITY): Payer: Medicaid Other

## 2018-01-16 ENCOUNTER — Encounter (HOSPITAL_COMMUNITY): Payer: Self-pay | Admitting: Emergency Medicine

## 2018-01-16 ENCOUNTER — Emergency Department (HOSPITAL_COMMUNITY)
Admission: EM | Admit: 2018-01-16 | Discharge: 2018-01-16 | Disposition: A | Discharge status: Home / Self Care | Payer: Medicaid Other | Attending: Emergency Medicine | Admitting: Emergency Medicine

## 2018-01-16 DIAGNOSIS — M25552 Pain in left hip: Secondary | ICD-10-CM | POA: Diagnosis not present

## 2018-01-16 DIAGNOSIS — Z79899 Other long term (current) drug therapy: Secondary | ICD-10-CM | POA: Insufficient documentation

## 2018-01-16 DIAGNOSIS — F17290 Nicotine dependence, other tobacco product, uncomplicated: Secondary | ICD-10-CM | POA: Diagnosis not present

## 2018-01-16 DIAGNOSIS — M545 Low back pain: Secondary | ICD-10-CM | POA: Diagnosis not present

## 2018-01-16 LAB — URINALYSIS, ROUTINE W REFLEX MICROSCOPIC
BILIRUBIN URINE: NEGATIVE
Glucose, UA: NEGATIVE mg/dL
Hgb urine dipstick: NEGATIVE
Ketones, ur: NEGATIVE mg/dL
Leukocytes, UA: NEGATIVE
Nitrite: NEGATIVE
PH: 8 (ref 5.0–8.0)
Protein, ur: NEGATIVE mg/dL
Specific Gravity, Urine: 1.019 (ref 1.005–1.030)

## 2018-01-16 LAB — POC URINE PREG, ED: Preg Test, Ur: NEGATIVE

## 2018-01-16 MED ORDER — CYCLOBENZAPRINE HCL 10 MG PO TABS: 10.0000 mg | ORAL_TABLET | Freq: Three times a day (TID) | ORAL | 0 refills | Status: AC | PRN

## 2018-01-16 MED ORDER — TRAMADOL HCL 50 MG PO TABS
50.0000 mg | ORAL_TABLET | Freq: Once | ORAL | Status: AC
  Administered 2018-01-16: 50 mg via ORAL
  Filled 2018-01-16: qty 1

## 2018-01-16 MED ORDER — IBUPROFEN 800 MG PO TABS
800.0000 mg | ORAL_TABLET | Freq: Once | ORAL | Status: AC
  Administered 2018-01-16: 800 mg via ORAL
  Filled 2018-01-16: qty 1

## 2018-01-16 MED ORDER — IBUPROFEN 800 MG PO TABS: 800.0000 mg | ORAL_TABLET | Freq: Three times a day (TID) | ORAL | 0 refills | Status: DC

## 2018-01-16 NOTE — ED Triage Notes (Signed)
Patient c/o left hip pain that started this morning upon waking. Denies any known injury. Patient reports taking tylenol this morning with brief relief. Pain worse with movement. Denies any other symptoms.

## 2018-01-16 NOTE — ED Provider Notes (Signed)
[facility] EMERGENCY DEPARTMENT Provider Note   CSN: [id number] Arrival date & time: 01/16/18  1750     History   Chief Complaint Chief Complaint  Patient presents with  . Hip Pain    HPI Jennifer Pollard is a 32 y.o. female.  HPI   Jennifer Pollard is a 32 y.o. female who presents to the Emergency Department complaining of left hip pain.  She describes a constant aching pain to the front and lateral left hip it began several hours prior to arrival.  She denies known injury.  She states the pain is associated with movement of her hip and walking.  Pain improved slightly after taking Tylenol.  She denies pain radiating into her lower leg or groin, swelling, abdominal pain, dysuria, fever, chills, or low back pain.   History reviewed. No pertinent past medical history.  There are no active problems to display for this patient.   History reviewed. No pertinent surgical history.   OB History    Gravida  1   Para  1   Term  1   Preterm      AB      Living  1     SAB      TAB      Ectopic      Multiple      Live Births               Home Medications    Prior to Admission medications   Medication Sig Start Date End Date Taking? Authorizing Provider  acetaminophen (TYLENOL) 500 MG tablet Take 1,000 mg by mouth every 6 (six) hours as needed for moderate pain.    [provider]  diclofenac (VOLTAREN) 50 MG EC tablet Take 1 tablet (50 mg total) by mouth 2 (two) times daily. 07/25/15   Fransico Meadow, PA-C  ibuprofen (ADVIL,MOTRIN) 600 MG tablet Take 1 tablet (600 mg total) by mouth every 6 (six) hours as needed. 09/18/17   Evalee Jefferson, PA-C  traMADol Veatrice Bourbon) 50 MG tablet 1 or 2 po q6h prn pain 08/29/14   Lily Kocher, PA-C    Family History No family history on file.  Social History Social History   Tobacco Use  . Smoking status: Current Some Day Smoker    Packs/day: 0.50    Types: Cigars  . Smokeless tobacco: Never Used  Substance Use  Topics  . Alcohol use: No  . Drug use: No     Allergies   Patient has no known allergies.   Review of Systems Review of Systems  Constitutional: Negative for fever.  Respiratory: Negative for shortness of breath.   Gastrointestinal: Negative for abdominal pain, constipation, nausea and vomiting.  Genitourinary: Negative for decreased urine volume, difficulty urinating, dysuria, flank pain, hematuria, vaginal bleeding and vaginal discharge.  Musculoskeletal: Positive for arthralgias (Left hip pain). Negative for back pain and joint swelling.  Skin: Negative for rash.  Neurological: Negative for weakness and numbness.     Physical Exam Updated Vital Signs BP 132/81 (BP Location: Right Arm)   Pulse 95   Temp (!) 96.5 F (35.8 C) (Oral)   Resp 16   Ht 5\' 4"  (1.626 m)   Wt 87.1 kg   SpO2 98%   BMI 32.96 kg/m   Physical Exam  Constitutional: She appears well-nourished. No distress.  HENT:  Head: Atraumatic.  Cardiovascular: Normal rate, regular rhythm and intact distal pulses.  Pulmonary/Chest: Effort normal. No respiratory distress.  Abdominal: Soft.  She exhibits no distension and no mass. There is no tenderness. There is no rebound and no guarding.  Musculoskeletal: She exhibits tenderness. She exhibits no edema or deformity.       Left hip: She exhibits tenderness. She exhibits no bony tenderness, no swelling and no crepitus.       Legs: ttp of the anterior left hip.  Pt uncooperative for exam, stating that she cannot perform SLR, but actively resisting me on passive ROM of left hip. Pt is smiling during exam.    Neurological: She is alert. No sensory deficit.  Skin: Skin is warm. Capillary refill takes less than 2 seconds.  Nursing note and vitals reviewed.    ED Treatments / Results  Labs (all labs ordered are listed, but only abnormal results are displayed) Labs Reviewed  URINALYSIS, ROUTINE W REFLEX MICROSCOPIC - Abnormal; Notable for the following components:       Result Value   APPearance HAZY (*)    All other components within normal limits  POC URINE PREG, ED    EKG None  Radiology Dg Lumbar Spine Complete  Result Date: 01/16/2018 CLINICAL DATA:  Pain without trauma EXAM: LUMBAR SPINE - COMPLETE 4+ VIEW COMPARISON:  None. FINDINGS: There is no evidence of lumbar spine fracture. Alignment is normal. Intervertebral disc spaces are maintained. IMPRESSION: Negative. Electronically Signed   By: Dorise Bullion III M.D   On: 01/16/2018 19:56   Dg Hip Unilat W Or Wo Pelvis 2-3 Views Left  Result Date: 01/16/2018 CLINICAL DATA:  Pain without trauma. EXAM: DG HIP (WITH OR WITHOUT PELVIS) 2-3V LEFT COMPARISON:  None. FINDINGS: There is no evidence of hip fracture or dislocation. There is no evidence of arthropathy or other focal bone abnormality. IMPRESSION: Negative. Electronically Signed   By: Dorise Bullion III M.D   On: 01/16/2018 19:55    Procedures Procedures (including critical care time)  Medications Ordered in ED Medications - No data to display   Initial Impression / Assessment and Plan / ED Course  I have reviewed the triage vital signs and the nursing notes.  Pertinent labs & imaging results that were available during my care of the patient were reviewed by me and considered in my medical decision making (see chart for details).     Pt reporting pain to left hip.  Actively resisting passive ROM of the hip.  No skin changes, abdomen and pelvis is soft, non-tender.  Work up reassuring.  Pt requesting crutches and work note.  Ambulatory at d/c.    Final Clinical Impressions(s) / ED Diagnoses   Final diagnoses:  Left hip pain    ED Discharge Orders    None       Kem Parkinson, PA-C 01/17/18 1211    Long, Wonda [facility], MD 01/18/18 [phone]

## 2018-01-16 NOTE — Discharge Instructions (Signed)
Apply ice packs on/off to your hip.  Follow-up with your doctor for recheck.

## 2018-01-21 DIAGNOSIS — Z3202 Encounter for pregnancy test, result negative: Secondary | ICD-10-CM | POA: Diagnosis not present

## 2018-01-21 DIAGNOSIS — Z3042 Encounter for surveillance of injectable contraceptive: Secondary | ICD-10-CM | POA: Diagnosis not present

## 2018-05-14 ENCOUNTER — Encounter (HOSPITAL_COMMUNITY): Payer: Self-pay | Admitting: Emergency Medicine

## 2018-05-14 ENCOUNTER — Other Ambulatory Visit: Payer: Self-pay

## 2018-05-14 ENCOUNTER — Emergency Department (HOSPITAL_COMMUNITY)
Admission: EM | Admit: 2018-05-14 | Discharge: 2018-05-14 | Disposition: A | Discharge status: Home / Self Care | Payer: Medicaid Other | Attending: Emergency Medicine | Admitting: Emergency Medicine

## 2018-05-14 ENCOUNTER — Emergency Department (HOSPITAL_COMMUNITY): Payer: Medicaid Other

## 2018-05-14 DIAGNOSIS — F17290 Nicotine dependence, other tobacco product, uncomplicated: Secondary | ICD-10-CM | POA: Insufficient documentation

## 2018-05-14 DIAGNOSIS — Z79899 Other long term (current) drug therapy: Secondary | ICD-10-CM | POA: Insufficient documentation

## 2018-05-14 DIAGNOSIS — M25562 Pain in left knee: Secondary | ICD-10-CM | POA: Diagnosis not present

## 2018-05-14 MED ORDER — IBUPROFEN 800 MG PO TABS
800.0000 mg | ORAL_TABLET | Freq: Once | ORAL | Status: AC
  Administered 2018-05-14: 800 mg via ORAL
  Filled 2018-05-14: qty 1

## 2018-05-14 MED ORDER — IBUPROFEN 800 MG PO TABS: 800.0000 mg | ORAL_TABLET | Freq: Three times a day (TID) | ORAL | 0 refills | Status: DC

## 2018-05-14 NOTE — ED Triage Notes (Signed)
Patient reports L knee pain that started 1 hour ago. No known injury.

## 2018-05-14 NOTE — ED Provider Notes (Signed)
[facility] EMERGENCY DEPARTMENT Provider Note   CSN: [id number] Arrival date & time: 05/14/18  1546    History   Chief Complaint Chief Complaint  Patient presents with  . Knee Pain    HPI Jennifer Pollard is a 33 y.o. female with no significant past medical history presenting with left knee pain which she woke with this morning. She reports anterior knee pain which radiates to her posterior knee and is constant, worsening with movement and has been unable to completely straighten the knee without increased pain.  She denies injury or prior problems with her knee, works in [employer], had yesterday off and was not active on the knee, stating she "did nothing" yesterday.  She denies fevers or chills, no history of gout.  She has applied icy hot and borrowed her mothers knee brace and crutches with improvement in pain.     The history is provided by the patient.    History reviewed. No pertinent past medical history.  There are no active problems to display for this patient.   History reviewed. No pertinent surgical history.   OB History    Gravida  1   Para  1   Term  1   Preterm      AB      Living  1     SAB      TAB      Ectopic      Multiple      Live Births               Home Medications    Prior to Admission medications   Medication Sig Start Date End Date Taking? Authorizing Provider  acetaminophen (TYLENOL) 500 MG tablet Take 1,000 mg by mouth every 6 (six) hours as needed for moderate pain.    [provider]  cyclobenzaprine (FLEXERIL) 10 MG tablet Take 1 tablet (10 mg total) by mouth 3 (three) times daily as needed. 01/16/18   Triplett, Tammy, PA-C  diclofenac (VOLTAREN) 50 MG EC tablet Take 1 tablet (50 mg total) by mouth 2 (two) times daily. 07/25/15   Fransico Meadow, PA-C  ibuprofen (ADVIL,MOTRIN) 800 MG tablet Take 1 tablet (800 mg total) by mouth 3 (three) times daily. 05/14/18   Evalee Jefferson, PA-C  traMADol Veatrice Bourbon) 50 MG tablet 1  or 2 po q6h prn pain 08/29/14   Lily Kocher, PA-C    Family History History reviewed. No pertinent family history.  Social History Social History   Tobacco Use  . Smoking status: Current Every Day Smoker    Packs/day: 1.00    Types: Cigars  . Smokeless tobacco: Never Used  Substance Use Topics  . Alcohol use: No  . Drug use: No     Allergies   Patient has no known allergies.   Review of Systems Review of Systems  Constitutional: Negative for fever.  Musculoskeletal: Positive for arthralgias and joint swelling. Negative for myalgias.  Neurological: Negative for weakness and numbness.     Physical Exam Updated Vital Signs BP 124/80 (BP Location: Right Arm)   Pulse 89   Temp 98.7 F (37.1 C) (Oral)   Resp 20   Ht 5\' 4"  (1.626 m)   Wt 68 kg   SpO2 99%   BMI 25.75 kg/m   Physical Exam Constitutional:      Appearance: She is well-developed.  HENT:     Head: Atraumatic.  Neck:     Musculoskeletal: Normal range of motion.  Cardiovascular:     Comments: Pulses equal bilaterally Musculoskeletal:        General: Swelling and tenderness present. No deformity.     Left knee: She exhibits swelling. She exhibits no effusion, no ecchymosis, no deformity, no erythema, normal alignment, no LCL laxity and no MCL laxity. Tenderness found. Patellar tendon tenderness noted.     Comments: Mild edema about the anterior patella. No increased warmth or erythema. No crepitus on palpation or with ROM.  Skin:    General: Skin is warm and dry.  Neurological:     Mental Status: She is alert.     Sensory: No sensory deficit.     Deep Tendon Reflexes: Reflexes normal.      ED Treatments / Results  Labs (all labs ordered are listed, but only abnormal results are displayed) Labs Reviewed - No data to display  EKG None  Radiology Dg Knee Complete 4 Views Left  Result Date: 05/14/2018 CLINICAL DATA:  Left knee pain, no known injury, initial encounter EXAM: LEFT KNEE -  COMPLETE 4+ VIEW COMPARISON:  None. FINDINGS: No evidence of fracture, dislocation, or joint effusion. No evidence of arthropathy or other focal bone abnormality. Soft tissues are unremarkable. IMPRESSION: No acute abnormality noted. Electronically Signed   By: Inez Catalina M.D.   On: 05/14/2018 17:38    Procedures Procedures (including critical care time)  Medications Ordered in ED Medications  ibuprofen (ADVIL,MOTRIN) tablet 800 mg (800 mg Oral Given 05/14/18 1737)     Initial Impression / Assessment and Plan / ED Course  I have reviewed the triage vital signs and the nursing notes.  Pertinent labs & imaging results that were available during my care of the patient were reviewed by me and considered in my medical decision making (see chart for details).        Imaging reviewed and discussed with patient.  Patient has left knee pain of unclear etiology.  She denies injury.  There is no exam findings to suggest septic joint, there is no increased warmth and she has no risk factors for this condition.  There is minimal edema anterior to the patella which is nontender.  She was placed on ibuprofen, discussed heat therapy.  She presented with a knee sleeve and crutches which she borrowed from her mother.  Referral to orthopedics for further management of symptoms persist or worsen.  Final Clinical Impressions(s) / ED Diagnoses   Final diagnoses:  Acute pain of left knee    ED Discharge Orders         Ordered    ibuprofen (ADVIL,MOTRIN) 800 MG tablet  3 times daily     05/14/18 1749           Evalee Jefferson, Hershal Coria 05/14/18 1752    Nat Christen, MD 05/14/18 [phone]

## 2018-05-14 NOTE — Discharge Instructions (Addendum)
Your x-rays are negative for any obvious source of your pain symptoms today.  I recommend using a heating pad 20 minutes several times daily.  The ibuprofen should also help with pain.  You may continue wearing your knee brace and your crutches if they help alleviate your pain symptoms.  Call Dr. Aline Brochure for further evaluation of your knee pain.

## 2018-08-10 ENCOUNTER — Other Ambulatory Visit: Payer: Self-pay

## 2018-09-18 ENCOUNTER — Encounter (HOSPITAL_BASED_OUTPATIENT_CLINIC_OR_DEPARTMENT_OTHER): Payer: Self-pay | Admitting: Physician Assistant

## 2018-09-20 NOTE — Progress Notes (Signed)
[place] PHONE / TELE-HEALTH VISIT    IDENTIFICATION DATA/CHIEF COMPLAINT:  Erica [name] is a very pleasant 33 year old who was first seen in clinic 12/03/2015 by Dr. Geanie Kenning for initial consult visit.   - 12/03/2015 - Initial visit (by Dr. Geanie Kenning)  - 12/24/2016 - Botox #1 (by Clayton Lefort ARNP)  - 04/02/2016 - Botox #2 - after treatment she had 3 weeks without migraines (by Clayton Lefort ARNP)  - 04/17/2016 - Follow up visit (by Dr. Geanie Kenning)    - 06/26/16 - Botox #3 - Mild  "droopy" eyelids after botox # 2 - injected higher up in frontalis with no further side effect  - 06/26/16 - Follow up visit (by Dr. Geanie Kenning)   - 09/23/2016 - Botox #4 (by Clayton Lefort ARNP)  - 01/28/2017 - Botox #5 (by Dorrene German PA-C)   - 12/21/2017 - LV one year ago - Follow up visit and Cymbalta taper (by Dorrene German PA-C)   - 09/21/2018 - Follow up visit using Telemedicine (by Dorrene German PA-C) Today    Distant Site Telemedicine Encounter  I conducted this encounter from [facility] via secure, live, face-to-face video conference (Zoom, HIPAA-compliant) with Erica [name] who was at home. This encounter is being conducted via telemedicine in order to comply with restrictions on travel due to the COVID-19 pandemic, and to prevent the patient from potential exposure to the novel corona virus.    Erica [name] has a diagnosis of (G43.701) Chronic migraine without aura with status migrainosus, not intractable  (primary encounter diagnosis)    Payor: [facility] [employer] [facility] [place] [facility] / Plan: [employer] [place] OPTIONS ACCESS PPO / Product Type: PPO    ========================================    BMI: 25.32 / 26.50 (today)  What is your goal of this visit? Follow up   How are you doing overall? Definitely better     Steps-to-Success shared visit: NO  Biofeedback shared visit: NO  Nutrition shared visit: NO  Sleep shared visit: NO    Cefaly for neuromodulation  - attended training: NO  - currently using:  NO    GammaCore Sapphire for vagal nerve stimulation:   - attended training: NO  - currently using: NO    Nerivio:   - attended training: NO  - currently using: NO    ========================================    CURRENT MEDICATION LIST PER TODAY 09/21/2018:    ACUTE MEDICATIONS  Currently taking:  - Sumatriptan 100 mg tablet - 3-4 [name] a month in June 2020, she noticed that sometimes 1 dose does not work and she needs a second dose (LV: 4 [name] in Sept 2019)  - Canabis smoke, or tincture or vape, higher CBD content for pain - every day, at night, helps to relax and go to sleep     Tried in the past:  - Ibuprofen - 0 [name] a month in June 2020 (LV: 2 [name] in Sept 2019)  - Ketorolac - she ran out 6 months ago, when she last took it.   - Aleve   - Maxalt (rizatriptan) - tried in the past 10 years ago, did not work    PREVENTIVE MEDICATIONS FOR HEADACHE  Currently taking:  - Duloxetine 40 mg daily, for the last 1-2 years - she would like to get off this medication, thinking about getting pregnant in 1-2 years from now.   - IUD (second Mirena) for the the last 7 years, well tolerated     Tried in the  past:  - BOTOX - it helped before, she is willing to try it again.   - Wellbutrin - 10 years ago for depression   - Effexor - 10 years ago for depression   - Remeron - 10 years ago for depression (gained weight and made her sleepy)  - Amitriptyline - 10 years ago for depression (side effects, gain weight, not working for her depression)   - Topamax for 2 years, even low dose impacted her speech, and foggy mind.   - Trokendi - same as Topamax   - Propranolol - low blood pressure so she had to stop     SUPPLEMENTS:  Currently taking:  - Migratone Relief Supplements  - Melatonin 3 mg at night     Tried in the past:  - Vitamin D3 3,000 IU      HISTORY OF PRESENT ILLNESS  [name] is a very pleasant 33 year old years old here today for follow up visit.   They report the following features of her headaches:    -  FREQUENCY (HA [name]/month): 16-18 [name] a month in June 2020 (LV: 4-5 [name] in Sept 2019)  - AVERAGE INTENSITY: 7-8/10 in June 2020 (LV: 5-6/10 in Sept 2019)  - DURATION: 48-72 hours in June 2020 (LV: 3-4 hours the HA itself, but including the prodrome and postdrome it will last up to all day - in Sept 2019).     She also had 6 [name] of more severe migraines 7-9/10 (LV: 1 migraine day 8-9/10 in Sept 2019).  She is open to try medical devices as well. She is interested in going back on ketorlac.     Review of Systems (ROS)  General: No fever, no chills, no unintentional weight Loss, no weight gain, no night sweats, no fatigue, no weakness.  Neurologic: Positive for headache. Denies memory difficulty, changes in vision, trouble concentrating, parasthesias    Outpatient Medications Prior to Visit   Medication Sig Dispense Refill    Cholecalciferol (VITAMIN D3) 1000 units Oral Tab Take 1,000 Units by mouth daily.      DULoxetine 20 MG Oral CAPSULE ENTERIC COATED PARTICLES Take 1 capsule (20 mg) by mouth 2 times a day. 60 capsule 11    Ketorolac Tromethamine 10 MG Oral Tab Take 1 tablet (10 mg) by mouth every 4 hours as needed for pain. 20 tablet 11    Melatonin 5 MG Oral Tab Take by mouth at bedtime.      Naproxen 500 MG Oral Tab Take 550 mg by mouth daily as needed.       Ondansetron HCl 8 MG Oral Tab Take 1 tablet (8 mg) by mouth every 8 hours as needed for nausea/vomiting. 12 tablet 11    SUMAtriptan Succinate 100 MG Oral Tab Take 1 tablet (100 mg) by mouth every 2 hours as needed for migraines. Take at onset of migraine. May repeat x 1 dose. Max 200 mg/24 hours 9 tablet 11    Unclassified (OTHER MEDS, SEE COMMENTS,) Cefaly device, use as directed (Patient not taking: Reported on 06/26/2016) 1 kit 0     No facility-administered medications prior to visit.        Allergies, Past Medical History, Problem List, Family history and Social history reviewed per EPIC.     Physical Exam:  There were no vitals taken for  this visit.  General: No acute distress, well-nourished, well-groomed, bright affect  Head: Normocephalic, atraumatic, normal hair distribution  Eyes: Sclera white, anicteric, conjunctiva clear  Neuro: Balanced gait, oriented x3, follows conversation easily    Diagnostic Studies/Lab:  None this visit    ========================================    ASSESSMENT AND PLAN:  Rina Adney is a very pleasant 33 year old female with diagnosis of (G43.701) Chronic migraine without aura with status migrainosus, not intractable  (primary encounter diagnosis)    CHANGES OF TODAY:    1. We agreed to try a short trial of steroid medication in order tobreak the cycle:    Prednisone 20 mg daily x 3 [name]. Headache should improve within 3 [name]. If headache does not improve, and you are tolerating the prednisone, you can increase the dose to 40 mg x 3 [name]. Then reduce back to 20 mg daily x 3-5 [name], then reduce to 10 mg daily x 3-5 [name], then reduce to 5 mg daily x 3 [name], then STOP.   Avoid all triptans, tylenol, ibuprofen and any other acute medication while you are taking the prednisone to reset the system.   Side effects include upset stomach (take with food), interrupted sleep (take in the morning), irritable mood. Long term side effects (which should not be a problem for this short burst of steroid) include decreased immune response, bone demineralization, swelling, elevated blood sugar.       2. Acute treatment:    Ubrelvy (ubrogepant) is a new medication for acute treatment or migraine. UBRELVY the first oral CGRP (Calcitonin Gene Related Peptide) receptor antagonist, FDA approved since January 2020 for the acute treatment of migraine with or without aura in adults. Doses available: tablets of 50 mg and 100 mg. Please take a first dose at onset of migraine and if the headache is not significantly improved in 2 hours, take a second dose.   The most common  reported side effects are nausea (4%) and sleepiness (3%). These  are not all of the possible side effects of UBRELVY.   There might be other side effects as well. Dose reduction is indicated for severe hepatic impairment. This medication is safe for heart. Usually the insurance approves 10 tablets a month.     Not indicated if you are pregnant or plan to become pregnant or are breastfeeding or plan to breastfeed     Drug Interactions of UBRELVY :   Strong CYP3A4 Inducers (phenytoin, barbiturates, rifampin, [place] John wort)  Should be avoided as concomitant use will result in reduction of ubrogepant exposure.     Dose modifications are recommended when using the following:   Moderate or weak CYP3A4 inhibitors ((cyclosporine, ciprofloxacin, fluconazole, fluvoxamine, grapefruit juice) and inducers   BRCP and/or P-gp Only Inhibitors (quinidine, carvedilol, curcumin, eltrombopag)     Dosage and Administration of UBRELVY   The recommended dose is 50 mg or 100 mg taken orally, as needed.   If needed, a second dose may be administered at least 2 hours after the initial dose.   The maximum dose in a 24-hour period is 200 mg. The safety of treating more than 8 migraines in a 30-day period has not been established.   Severe Hepatic or Severe Renal Impairment: Recommended dose is 50 mg; if needed, a second 50 mg dose may be taken at least 2 hours after the initial dose   Avoid use in patients with end-stage renal disease.       3. Preventive treatment:    We agreed to go back on Botox treatment for chronic migraine.     Botulinum toxin type A (BOTOX)  If you get any  side effects, talk to your doctor or pharmacist. This includes any possible side effects not listed in this leaflet.     How BOTOX works:  BOTOX is a focally acting neurotoxin which paralyzes the muscles into which it is injected. It is used to treat a number of conditions within the body, including chronic migraine. It contains the active substance Botulinum toxin type A and is injected into the muscles, or deep into the skin.  It works by partially blocking the nerve impulses to any muscles that have been injected and reduces excessive contractions of these muscles. In the case of chronic migraine, it is thought that BOTOX  blocks pain signals, which indirectly block the development of a migraine.   Specifically, it inhibits the release of neurotransmitters involved in pain transmission (glutamate, substance P, CGRP).1 This inhibits neurogenic inflammation, blocks peripheral sensitization of nociceptive nerve fibers and indirectly decreases central sensitization.1 It also leads to decreased tension in the face, neck and head muscles due to potent, selective and long-lasting blockade of acetylcholine release.     Who does BOTOX work for?  BOTOX is used to prevent headaches in adult patients with chronic migraine. Chronic migraine is a disease affecting the nervous system. To be diagnosed with chronic migraine, you must have headaches 15 [name] or more a month. In addition, on 8 or more [name] a month, your headaches must have at least two of the following characteristics as described by the International Classification of Headache Disorders (ICHD-3 beta):    affect only one side of the head    cause a pulsating pain    cause moderate to severe pain    are aggravated by routine physical activity   And they must cause at least one of the following:    nausea, vomiting, or both    sensitivity to light and sound.   Headaches must be following this pattern for at least 3 months to be considered chronic.     Research supporting BOTOX:  The two clinical trials that supported FDA approval of BOTOX for chronic migraine were called the PHASE III Research Evaluating Migraine Prophylaxis Therapy (PREEMPT) protocol.2 This extensively tested protocol resulted in the technique of 31 small injections of 5 units each of BOTOX into designated locations over the forehead, temples, back of the head (occipital region) and neck (trapezius). The amount of  medicine approved and studied is 155 units, however the vials come in 100 or 200 units. Some providers will offer the additional units to be injected in particular areas of pain, calling this the follow the pain approach. Many providers who participated in the PREEMPT study performed this technique. Unfortunately, it is not fully known whether this provides additional benefit or not, so your provider may decide to limit treatment to the designated areas only.     When to expect results  In clinical trials, BOTOX provided a significant reduction in headache [name] after the first treatment. After the second treatment (at 24 weeks), BOTOX prevented even greater reduction in headache, up to 9 headache [name] a month. The results vary amongst individuals. It can provide benefit as soon as 2 weeks and as long as 24 weeks. Because it can take up to  6 months to see results, it is important to continue treatment for at least two rounds of BOTOX to give this therapy a true test. BOTOX has been shown to significantly reduce the frequency of [name] with headache and to improve the quality of life of patients  suffering from chronic migraine. After two treatment sessions, approximately 47% of patients had a 50% or greater reduction from baseline in the number of [name] with headache they experienced. BOTOX does not prevent headaches in patients with episodic migraine, which occur less than 15 [name] a month.     How frequently do I need injections?  The BOTOX effect lasts for about 3 months. Most insurance companies will only allow for you to have injections every 3 months for this reason. It is also important not to have too frequent of injections due to the potential of developing antibodies that make the toxin less effective. It is not recommended to have the injections more often than every 2-3 months depending on the indication. For chronic migraine, we recommend every 3 months.     Here's what to expect during your  treatment:  BOTOX must only be injected by providers with specific skills and experience on how to use the medicine. At the [facility], all of our providers have gone through specific training and mentoring in BOTOX injection techniques. BOTOX is injected into your muscles (intramuscularly), or into the skin (intradermally). It is injected directly into the forehead, temples, back of the head and shoulders.  We use the smallest needle available, so for some people this pain is barely noticeable (especially when compared to their migraine pain!), but for other people they may find this to be uncomfortable. We can offer ice packs or numbing cream to use before the procedure for these individuals, but most people are okay without.   Injections to 7 key areas of the head and neck  Very fine needles are used for 31 total injections  Injections feel like tiny pinpricks--we use very small needles, however this may be painful, especially if you have a sensitive scalp.     Side effects:  The most common side effect in Chronic Migraine clinical studies was neck pain experienced by 9% of BOTOX patients (vs 3% in placebo)2.   Some individuals will have worse headache for the day or two after injection, though not consistently. If the BOTOX spreads slightly or is injected too low in the forehead, it can cause eye droop. If this happens, the effects will last the duration of the BOTOX, usually 3 months and then will return to normal. This is not a permanent side effect.  Other side effects include slight or partial facial paralysis, musculoskeletal stiffness, muscle weakness, muscle pain, injection-site pain, dry mouth and high blood pressure.   As with any injection, it is possible for the procedure to result in infection, pain, swelling, burning and stinging, increased sensitivity, tenderness, redness, and/or bleeding/bruising at the site of injection.     Spread of toxin effects.   Side effects possibly related  to the spread of toxin distant from the site of administration have been reported with botulinum toxin. Symptoms could include muscle weakness, difficulty swallowing, double vision, blurred vision, hoarseness or change or loss of voice, or trouble breathing.  This is a particular risk for patients with an underlying illness that makes them susceptible to these symptoms.   There has not  been a confirmed serious case of spread of toxin effect away from the injection site when BOTOX has been used at the recommended dose to treat chronic migraine.    To help prevent spread of toxin effects, notify us if you have any muscle or nerve conditions such as amyotrophic lateral sclerosis (ALS or Lou Gehrig's disease), myasthenia gravis, or Lambert-Eaton  syndrome, as you may be at increased risk of serious side effects including severe dysphagia (difficulty swallowing) and respiratory compromise (difficulty breathing) from typical doses of BOTOX.    To prevent other serious complications, notify us if you have other medical conditions, for example if you: have or have had bleeding problems; have plans to have surgery; had surgery on your face; weakness of forehead muscles, such as trouble raising your eyebrows; drooping eyelids; any other abnormal facial change; are pregnant or plan to become pregnant (it is not known if BOTOX can harm your unborn baby); are breastfeeding or plan to breastfeed (it is not known if BOTOXpasses into breast milk).    Do not take BOTOX if you: are allergic to any of the ingredients in BOTOX (see Medication Guide for ingredients); had an allergic reaction to any other botulinum toxin product such as Myobloc(rimabotulinumtoxinB), Dysport (abobotulinumtoxinA), or Xeomin (incobotulinumtoxinA); have a skin infection at the planned injection site.    Serious and/or immediate allergic reactions have been reported.   These reactions include itching, rash, red itchy welts, wheezing, asthma symptoms,  or dizziness or feeling faint. Tell your provider or get medical help right away if you experience any such symptoms; further injection of BOTOX should be discontinued.    Be sure to notify us if you are taking certain medications.  Using BOTOXwith certain other medicines may cause serious side effects. Do not start any new medicines until you have told your provider that you have received BOTOX in the past.  Especially tell your provider if you: have received any other botulinum toxin product in the last 4 months; have received injections of botulinum toxin such as Myobloc, Dysport, or Xeomin in the past; have recently received an antibiotic by injection; take muscle relaxants; take an allergy or cold medicine; take a sleep medicine; take anti-platelets (aspirin-like products) or anti-coagulants (blood thinners).  You are encouraged to report negative side effects of prescription drugs to the FDA. Visit [employer] or call 1-800-FDA-1088.  This does not cover all the possible serious side effects of BOTOX.     References  1. Horald Pollen, [place]. Neurotherapeutics to inhibit exocytosis from sensory neurons for the control of chronic pain. Current opinion in pharmacology. 2012;12(1):100-108.  2. Aurora S, Dodick D, Diener [facility], et al. OnabotulinumtoxinA for chronic migraine: efficacy, safety, and tolerability in patients who received all five treatment cycles in the PREEMPT clinical program. Acta neurologica Lambert Mody. 2014;129(1):61-70.        4. Consider our shared visits:    SHARED VISITS - [facility]:  We are very proud in our clinic to offer newly developed virtual shared visits, offering helpful tools to our patients who cope with headaches. Find bellow a list of our classes and a schedule proposal, available starting July 1st 2020. The schedule is subject to change as needed. All classes will be run using Zoom program and Telemedicine. Please call the Headache Clinic front desk for  appointments and a walk though the Telemedicine process. Our phone number is [phone], ext. [phone].     (1) Steps to Success (by Dr. Fransico Setters)  Headaches are complex. This class covers general information about this vast territory of how the brain works, why we develop migraines, and options for treatments.     (2) Newer Medications (by Dr. Fransico Setters / Dr. Lambert Keto)  There are several medications that have been developed specifically for migraine (and one for cluster headache) in the last few years. Some of  these are abortive treatments (to take when a patient has acute headache); others include several preventive treatments (for example, monthly injections to self-administer at home, and even a new infusion that can be delivered in the clinic once every 3 months). This class teaches about new, exciting updates in headache medications.    (3) Vyepti (by Dr. Fransico Setters)  VYEPTI is a CGRP blocker for migraine prevention. Discover how one 30-minute Infused IV treatment 4 times per year may help prevent migraine headaches.    (4) Nutrition (by Dr. Fransico Setters)  - Microbiome matters  - Foods that help microbiome  - Anti-inflammatory foods that promote health and help with headache management    (5) Cognitive Behavioral Therapy or CBT (by Dr. Fransico Setters)  This breathing technique could change unhelpful cognitive behaviors, with the goal of improving emotional regulation and reduce stress, anxiety and depression, improving overall the migraine disability.    (6) Biofeedback (by Dr. Fransico Setters)  This is a process where monitoring of a normally automatic body function in used to train someone to acquire voluntary control of the function    (7) Medication Overuse Headache or MOH (by Dr. Lambert Keto)  Many Headache Clinic patients unfortunately have had disabling headaches for extended periods of time. When they see [place], in addition to a diagnosis of migraine or other primary headache  disorder, about 80% of patients also have a diagnosis of medication overuse headache. Medication overuse headache can occur with frequent use of abortive medications, such as Tylenol, Advil, triptans and others. For patients to achieve maximal benefit of the headache treatments we offer, it is important to eliminate medication overuse as soon as possible. This class describes medication overuse headache, why it occurs, and how we use our extensive toolkit to treat patients with this condition, by providing alternatives to abortive medication overuse.     (8) Headache SOS/other bridge options (by Dr. Lambert Keto)  Many patients with headache experience periodic headache exacerbations. Headaches can be very severe during these exacerbations, and as we aim to support our patients, our overall goal is to treat their exacerbations effectively and at the same time, to help provide patients with options so that they do not have to resort to the Emergency Department or Urgent Care. This class reviews a range of bridge therapy options about which patients can speak with either their headache or primary care providers, so that a plan is in place ahead of time.       (9) Sleep 1 of 2 (with Mui Chan-Goh, ARNP, DNP)  We will be discussing overview of sleep, sleep diary and general sleep hygiene . Any one who has migraines and trouble with sleep can benefit from this class.  You will receive an email one day to a week before your scheduled class date requesting you to complete a sleep survey through [employer]. Please complete this survey before the class as we will need this information to know how you are doing with sleep and headaches in general    (10) Sleep 2 of  2 (with Mui Chan-Goh, ARNP, DNP)  Anyone with migraines and insomnia who has attended Sleep 1 of 2 can attend this class. We will be discussing Principles of Cognitive Behavioral Therapy for Insomnia (CBT-I).Please upload your sleep diary via [employer] survey prior to class  as we will need this information to apply the CBT-I concepts learnt in class.     (11) Exercise (by Dorrene German PA-C)  As an important part  of the multimodal headache management, EXERCISE is the magic elixir that addresses pain through serotonine release, reduces stress and improves the quality of sleep. Migraine, cervicogenic headache, post-concussion recovery, pregnancy, weight management, they all benefit from exercise. So let's get started and feel better today, every day!    (12) Neuromodulation with medical devices for headache (by Dorrene German PA-C)  Neuromodulation with [employer] - Success in healing headaches: a multimodal headache management is more efficient than medications alone. Part of this treatment is neuromodulation with medical devices designed to treat headaches. Let's explore this avenue, controlling pain and brain function with this safe non-medication option.        MON TUE WED THU FRI   A.M. --- --- --- --- ---   P.M.  1:00 2, 7, 8   Dr. Lambert Keto    11, 12  Haydon Dorris PA-C 1, 2, 3, 4, 5, 6  Dr. Elray Mcgregor Murinova  9, [place] 1, 2, 3, 4, 5, 6   Dr. Elray Mcgregor Murinova          4. Follow up with Dorrene German PA-C in 4 weeks or sooner if any new problems related to headache management arise.    ====================    We continue to recommend a multimodal approach to headache management. Your participation in your care is immensely important and we are excited about the work you have done so far to improve your overall health and headache symptoms.     Acute (abortive) medications: these medications are to be taken only at the onset of severe headache. Please limit the total of any combination of these medications to less than 6 [name] per month on average because they can cause rebound (medication-use) headaches.     Natural approaches: to increasing brain energy and serotonin:   SAMe 200 mg a day   Vitamin B2 400 mg daily   Vitamin B12 1000 mcg daily   Getting early morning  light to increase natural serotonin, melatonin. Could also consider light therapy box (happy lamp) - 10,000 LUX.     Supplements over-the counter for prevention and acute treatment of migraine   Boswellia 800 mg 2-3 times per day, example brand Swanson's, natural anti inflammatory herb   Sleep modulation- melatonin at night 5 to 10 mg 30 minutes prior to sleep   Feverfew Tea 1-3 cups per day. Can get from buddhateas.com or tenzingmomo.com- A Natural anti inflammatory approach that does not cause further sensitization of the system.   Magnesium Citrate 250 mg daily, slowly increase up to [phone] mg daily. Side effect may include diarrhea, so we recommend stopping at whatever dose is comfortable for your body without causing this side effect. This supplement can be very helpful for preventing headache, preventing migraine aura, calming nerves, muscles and even mood. Recommend starting slowly, as it can also lead to increased bowel movements or diarrhea.      General recommendations for lifestyle management of headaches:     Exercise   Exercise routine should include both:  o Aerobic - like walking, swimming, jogging, biking, elliptical  o Anaerobic (muscle building) - like yoga, Pilates, Tai Chi, light weights or resistance bands   Stay hydrated before and after exercise   Have a healthy snack after exercise even if you do not feel hungry    Nutrition   Home made bone broth   Increase the protein intake in the morning (meat, dairy, soy, nuts, beans)    Avoid foods with high  glycemic index (soda and juice, sugars and sweeteners, processed carbohydrates, see www.glycemicindex.com to look up the foods you typically eat)   Limit caffeine to 1-2 cups of coffee or tea per day   Book recommendation: The [address], by Margarito Liner is a good guideline to anti-inflammatory diet   Consider attending the nutrition class with Dr. Herbert Deaner to learn more about nutrition for headache prevention      Sleep   Establish a regular bedtime and awakening time, even on nights where you didn't sleep well   Keep daytime naps to one hour or less   If you regularly use a computer, tablet, or smartphone in the evening, download a filter program that will dim the screen and filter out the blue light to prevent this from interfering with your sleep cycle (examples: Twilight app on Android phone, Night Shift on iPhone, F.lux on Windows computer)   Melatonin 3-10 mg (or Melatonin with L-theanene) taken 30 minutes before bedtime helps prepare the brain for sleep and can help prevent migraines as well   Normalize the day/night rhythm of the brain by getting outside in the morning for natural sunlight exposure, or use a sun lamp 10,000 LUX for at least 15-30 minutes first thing in the morning (can be bought on [employer] for ~$40)   Consider attending the sleep class with Clayton Lefort, ARNP, to learn about strategies for improving your sleep quality     Here are a few tips to improve your healthcare:     Please fill out your RED CAP survey prior to each visit.      Refills: Call your pharmacy at least 7 working [name] before you run out. Please do not call the clinic for refills, it is quicker and safer to go through your pharmacy. We usually prescribe the medication for one year. If you continue to receive the prescription from [place], we require at least one yearly visit for refills. Please schedule your appointment one month prior to running out.      Test Results: Available in 1-2 weeks on eCare. We will contact you sooner by telephone if there is something more urgent.      Urgent Symptoms: Call [phone], day or night, and select option 8. Our clinic staff will help you during regular hours; after hours, [place] [facility] [place] on-call nurses will help you. If life-threatening, please call 911.       Other Questions: Use eCare to securely e-mail [place]. If you have a long or complex question or a new issue, please  make an appointment, call [phone], day or night, and select option 8. To sign up for eCare call [phone] or ask your medical assistant to sign you up when in clinic.       Headache Clinic Policies:     - You need be seen at least once a year to continue safely receiving prescriptions from [place].  - We do not coordinate disability or social security, however we can provide our note to your primary care provider to help you coordinate the process.  - We can assist with family medical leave paperwork (FMLA); filling out the paperwork requires a face-to-face office visit.   - Schedule appointments ahead of time; we do not have urgent, same-day appointments available.  - We do not prescribe opioids or other controlled substances aside from Lyrica.  - To assure best access to care for all of our patients, we require at least 48 hours notice prior to cancelling appointments.  Benefits, side effects, and alternatives were discussed.   The patient understands all the recommendations.     The patient is agreeable with the plan.      I spent over 40 minutes in total with the patient including face to face time using Telemedicine, reviewing chart notes, counseling regarding the diagnosis and treatment options and documenting the visit.      Dorrene German, PA-C, [facility]

## 2018-09-21 ENCOUNTER — Encounter (HOSPITAL_BASED_OUTPATIENT_CLINIC_OR_DEPARTMENT_OTHER): Payer: Commercial Managed Care - PPO | Admitting: Physician Assistant

## 2018-09-21 ENCOUNTER — Ambulatory Visit: Payer: Commercial Managed Care - PPO | Attending: Physician Assistant | Admitting: Physician Assistant

## 2018-09-21 DIAGNOSIS — G43701 Chronic migraine without aura, not intractable, with status migrainosus: Secondary | ICD-10-CM | POA: Insufficient documentation

## 2018-09-21 MED ORDER — UBROGEPANT 50 MG OR TABS
50.0000 mg | ORAL_TABLET | Freq: Once | ORAL | 11 refills | Status: DC | PRN
Start: 2018-09-21 — End: 2019-09-28

## 2018-09-21 MED ORDER — PREDNISONE 10 MG OR TABS
10.0000 mg | ORAL_TABLET | Freq: Two times a day (BID) | ORAL | 0 refills | Status: DC
Start: 2018-09-21 — End: 2019-06-30

## 2018-09-21 NOTE — Patient Instructions (Signed)
 ASSESSMENT AND PLAN:  Erica [name] is a very pleasant 33 year old female with diagnosis of (G43.701) Chronic migraine without aura with status migrainosus, not intractable  (primary encounter diagnosis)    CHANGES OF TODAY:    1. We agreed to try a short trial of steroid medication in order tobreak the cycle:    Prednisone 20 mg daily x 3 days. Headache should improve within 3 days. If headache does not improve, and you are tolerating the prednisone, you can increase the dose to 40 mg x 3 days. Then reduce back to 20 mg daily x 3-5 days, then reduce to 10 mg daily x 3-5 days, then reduce to 5 mg daily x 3 days, then STOP.   Avoid all triptans, tylenol, ibuprofen and any other acute medication while you are taking the prednisone to reset the system.   Side effects include upset stomach (take with food), interrupted sleep (take in the morning), irritable mood. Long term side effects (which should not be a problem for this short burst of steroid) include decreased immune response, bone demineralization, swelling, elevated blood sugar.       2. Acute treatment:    Ubrelvy (ubrogepant) is a new medication for acute treatment or migraine. UBRELVYT the first oral CGRP (Calcitonin Gene Related Peptide) receptor antagonist, FDA approved since January 2020 for the acute treatment of migraine with or without aura in adults. Doses available: tablets of 50 mg and 100 mg. Please take a first dose at onset of migraine and if the headache is not significantly improved in 2 hours, take a second dose.   The most common  reported side effects are nausea (4%) and sleepiness (3%). These are not all of the possible side effects of UBRELVY.   There might be other side effects as well. Dose reduction is indicated for severe hepatic impairment. This medication is safe for heart. Usually the insurance approves 10 tablets a month.     Not indicated if you are pregnant or plan to become pregnant or are breastfeeding or plan to  breastfeed     Drug Interactions of UBRELVY :   Strong CYP3A4 Inducers (phenytoin, barbiturates, rifampin, St John wort)  Should be avoided as concomitant use will result in reduction of ubrogepant exposure.     Dose modifications are recommended when using the following:   Moderate or weak CYP3A4 inhibitors ((cyclosporine, ciprofloxacin, fluconazole, fluvoxamine, grapefruit juice) and inducers   BRCP and/or P-gp Only Inhibitors (quinidine, carvedilol, curcumin, eltrombopag)     Dosage and Administration of UBRELVY   The recommended dose is 50 mg or 100 mg taken orally, as needed.   If needed, a second dose may be administered at least 2 hours after the initial dose.   The maximum dose in a 24-hour period is 200 mg. The safety of treating more than 8 migraines in a 30-day period has not been established.   Severe Hepatic or Severe Renal Impairment: Recommended dose is 50 mg; if needed, a second 50 mg dose may be taken at least 2 hours after the initial dose   Avoid use in patients with end-stage renal disease.       3. Preventive treatment:    We agreed to go back on Botox treatment for chronic migraine.     Botulinum toxin type A (BOTOX)  If you get any side effects, talk to your doctor or pharmacist. This includes any possible side effects not listed in this leaflet.     How  BOTOX works:  BOTOX is a focally acting neurotoxin which paralyzes the muscles into which it is injected. It is used to treat a number of conditions within the body, including chronic migraine. It contains the active substance Botulinum toxin type A and is injected into the muscles, or deep into the skin. It works by partially blocking the nerve impulses to any muscles that have been injected and reduces excessive contractions of these muscles. In the case of chronic migraine, it is thought that BOTOX  blocks pain signals, which indirectly block the development of a migraine.   Specifically, it inhibits the release of neurotransmitters  involved in pain transmission (glutamate, substance P, CGRP).1 This inhibits neurogenic inflammation, blocks peripheral sensitization of nociceptive nerve fibers and indirectly decreases central sensitization.1 It also leads to decreased tension in the face, neck and head muscles due to potent, selective and long-lasting blockade of acetylcholine release.     Who does BOTOX work for?  BOTOX is used to prevent headaches in adult patients with chronic migraine. Chronic migraine is a disease affecting the nervous system. To be diagnosed with chronic migraine, you must have headaches 15 days or more a month. In addition, on 8 or more days a month, your headaches must have at least two of the following characteristics as described by the International Classification of Headache Disorders (ICHD-3 beta):   Marland Kitchen affect only one side of the head   . cause a pulsating pain   . cause moderate to severe pain   . are aggravated by routine physical activity   And they must cause at least one of the following:   . nausea, vomiting, or both   . sensitivity to light and sound.   Headaches must be following this pattern for at least 3 months to be considered chronic.     Research supporting BOTOX:  The two clinical trials that supported FDA approval of BOTOX for chronic migraine were called the PHASE III Research Evaluating Migraine Prophylaxis Therapy (PREEMPT) protocol.2 This extensively tested protocol resulted in the technique of 31 small injections of 5 units each of BOTOX into designated locations over the forehead, temples, back of the head (occipital region) and neck (trapezius). The amount of medicine approved and studied is 155 units, however the vials come in 100 or 200 units. Some providers will offer the additional units to be injected in particular areas of pain, calling this the "follow the pain" approach. Many providers who participated in the PREEMPT study performed this technique. Unfortunately, it is not fully  known whether this provides additional benefit or not, so your provider may decide to limit treatment to the designated areas only.     When to expect results  In clinical trials, BOTOX provided a significant reduction in headache days after the first treatment. After the second treatment (at 24 weeks), BOTOX prevented even greater reduction in headache, up to 9 headache days a month. The results vary amongst individuals. It can provide benefit as soon as 2 weeks and as long as 24 weeks. Because it can take up to  6 months to see results, it is important to continue treatment for at least two rounds of BOTOX to give this therapy a true test. BOTOX has been shown to significantly reduce the frequency of days with headache and to improve the quality of life of patients suffering from chronic migraine. After two treatment sessions, approximately 47% of patients had a 50% or greater reduction from baseline in the number of  days with headache they experienced. BOTOX does not prevent headaches in patients with episodic migraine, which occur less than 15 days a month.     How frequently do I need injections?  The BOTOX effect lasts for about 3 months. Most insurance companies will only allow for you to have injections every 3 months for this reason. It is also important not to have too frequent of injections due to the potential of developing antibodies that make the toxin less effective. It is not recommended to have the injections more often than every 2-3 months depending on the indication. For chronic migraine, we recommend every 3 months.     Here's what to expect during your treatment:  BOTOX must only be injected by providers with specific skills and experience on how to use the medicine. At the [facility], all of our providers have gone through specific training and mentoring in BOTOX injection techniques. BOTOX is injected into your muscles (intramuscularly), or into the skin (intradermally). It is  injected directly into the forehead, temples, back of the head and shoulders.  We use the smallest needle available, so for some people this pain is barely noticeable (especially when compared to their migraine pain!), but for other people they may find this to be uncomfortable. We can offer ice packs or numbing cream to use before the procedure for these individuals, but most people are okay without.   Injections to 7 key areas of the head and neck  Very fine needles are used for 31 total injections  Injections feel like tiny pinpricks--we use very small needles, however this may be painful, especially if you have a sensitive scalp.     Side effects:  The most common side effect in Chronic Migraine clinical studies was neck pain experienced by 9% of BOTOX patients (vs 3% in placebo)2.   Some individuals will have worse headache for the day or two after injection, though not consistently. If the BOTOX spreads slightly or is injected too low in the forehead, it can cause eye droop. If this happens, the effects will last the duration of the BOTOX, usually 3 months and then will return to normal. This is not a permanent side effect.  Other side effects include slight or partial facial paralysis, musculoskeletal stiffness, muscle weakness, muscle pain, injection-site pain, dry mouth and high blood pressure.   As with any injection, it is possible for the procedure to result in infection, pain, swelling, burning and stinging, increased sensitivity, tenderness, redness, and/or bleeding/bruising at the site of injection.     Spread of toxin effects.   Side effects possibly related to the spread of toxin distant from the site of administration have been reported with botulinum toxin. Symptoms could include muscle weakness, difficulty swallowing, double vision, blurred vision, hoarseness or change or loss of voice, or trouble breathing.  This is a particular risk for patients with an underlying illness that makes them  susceptible to these symptoms.   There has not  been a confirmed serious case of spread of toxin effect away from the injection site when BOTOX has been used at the recommended dose to treat chronic migraine.    To help prevent spread of toxin effects, notify us if you have any muscle or nerve conditions such as amyotrophic lateral sclerosis (ALS or Lou Gehrig's disease), myasthenia gravis, or Lambert-Eaton syndrome, as you may be at increased risk of serious side effects including severe dysphagia (difficulty swallowing) and respiratory compromise (difficulty breathing) from typical  doses of BOTOX.    To prevent other serious complications, notify us if you have other medical conditions, for example if you: have or have had bleeding problems; have plans to have surgery; had surgery on your face; weakness of forehead muscles, such as trouble raising your eyebrows; drooping eyelids; any other abnormal facial change; are pregnant or plan to become pregnant (it is not known if BOTOX can harm your unborn baby); are breastfeeding or plan to breastfeed (it is not known if BOTOXpasses into breast milk).    Do not take BOTOX if you: are allergic to any of the ingredients in BOTOX (see Medication Guide for ingredients); had an allergic reaction to any other botulinum toxin product such as Myobloc(rimabotulinumtoxinB), Dysport (abobotulinumtoxinA), or Xeomin (incobotulinumtoxinA); have a skin infection at the planned injection site.    Serious and/or immediate allergic reactions have been reported.   These reactions include itching, rash, red itchy welts, wheezing, asthma symptoms, or dizziness or feeling faint. Tell your provider or get medical help right away if you experience any such symptoms; further injection of BOTOX should be discontinued.    Be sure to notify us if you are taking certain medications.  Using BOTOXwith certain other medicines may cause serious side effects. Do not start any new medicines  until you have told your provider that you have received BOTOX in the past.  Especially tell your provider if you: have received any other botulinum toxin product in the last 4 months; have received injections of botulinum toxin such as Myobloc, Dysport, or Xeomin in the past; have recently received an antibiotic by injection; take muscle relaxants; take an allergy or cold medicine; take a sleep medicine; take anti-platelets (aspirin-like products) or anti-coagulants (blood thinners).  You are encouraged to report negative side effects of prescription drugs to the FDA. Visit [employer] or call 1-800-FDA-1088.  This does not cover all the possible serious side effects of BOTOX.     References  1. Gretchen Portela, [place]. Neurotherapeutics to inhibit exocytosis from sensory neurons for the control of chronic pain. Current opinion in pharmacology. 2012;12(1):100-108.  2. Aurora S, Dodick D, Diener [facility], et al. OnabotulinumtoxinA for chronic migraine: efficacy, safety, and tolerability in patients who received all five treatment cycles in the PREEMPT clinical program. Acta neurologica [place]. 2014;129(1):61-70.        4. Consider our shared visits:    SHARED VISITS - [facility]:  We are very proud in our clinic to offer newly developed virtual shared visits, offering helpful tools to our patients who cope with headaches. Find bellow a list of our classes and a schedule proposal, available starting July 1st 2020. The schedule is subject to change as needed. All classes will be run using Zoom program and Telemedicine. Please call the Headache Clinic front desk for appointments and a walk though the Telemedicine process. Our phone number is [phone], ext. [phone].     (1) Steps to Success (by Dr. Maxie Better)  Headaches are complex. This class covers general information about this vast territory of how the brain works, why we develop migraines, and options for treatments.     (2) Newer  Medications (by Dr. Maxie Better / Dr. Berneta Sages)  There are several medications that have been developed specifically for migraine (and one for cluster headache) in the last few years. Some of these are abortive treatments (to take when a patient has acute headache); others include several preventive treatments (for example, monthly injections to self-administer at  home, and even a new infusion that can be delivered in the clinic once every 3 months). This class teaches about new, exciting updates in headache medications.    (3) Vyepti (by Dr. Maxie Better)  VYEPTI is a CGRP blocker for migraine prevention. Discover how one 30-minute Infused IV treatment 4 times per year may help prevent migraine headaches.    (4) Nutrition (by Dr. Maxie Better)  - Microbiome matters  - Foods that help microbiome  - Anti-inflammatory foods that promote health and help with headache management    (5) Cognitive Behavioral Therapy or CBT (by Dr. Maxie Better)  This breathing technique could change unhelpful cognitive behaviors, with the goal of improving emotional regulation and reduce stress, anxiety and depression, improving overall the migraine disability.    (6) Biofeedback (by Dr. Maxie Better)  This is a process where monitoring of a normally automatic body function in used to train someone to acquire voluntary control of the function    (7) Medication Overuse Headache or MOH (by Dr. Berneta Sages)  Many Headache Clinic patients unfortunately have had disabling headaches for extended periods of time. When they see [place], in addition to a diagnosis of migraine or other primary headache disorder, about 80% of patients also have a diagnosis of medication overuse headache. Medication overuse headache can occur with frequent use of abortive medications, such as Tylenol, Advil, triptans and others. For patients to achieve maximal benefit of the headache treatments we offer, it is important to eliminate medication overuse  as soon as possible. This class describes medication overuse headache, why it occurs, and how we use our extensive toolkit to treat patients with this condition, by providing alternatives to abortive medication overuse.     (8) Headache SOS/other bridge options (by Dr. Berneta Sages)  Many patients with headache experience periodic headache exacerbations. Headaches can be very severe during these exacerbations, and as we aim to support our patients, our overall goal is to treat their exacerbations effectively and at the same time, to help provide patients with options so that they do not have to resort to the Emergency Department or Urgent Care. This class reviews a range of bridge therapy options about which patients can speak with either their headache or primary care providers, so that a plan is in place ahead of time.       (9) Sleep 1 of 2 (with Mui Chan-Goh, ARNP, DNP)  We will be discussing overview of sleep, sleep diary and general sleep hygiene . Any one who has migraines and trouble with sleep can benefit from this class.  You will receive an email one day to a week before your scheduled class date requesting you to complete a sleep survey through [employer]. Please complete this survey before the class as we will need this information to know how you are doing with sleep and headaches in general    (10) Sleep 2 of  2 (with Mui Chan-Goh, ARNP, DNP)  Anyone with migraines and insomnia who has attended Sleep 1 of 2 can attend this class. We will be discussing Principles of Cognitive Behavioral Therapy for Insomnia (CBT-I).Please upload your sleep diary via [employer] survey prior to class as we will need this information to apply the CBT-I concepts learnt in class.     (11) Exercise (by Genevieve Norlander PA-C)  As an important part of the multimodal headache management, EXERCISE is the magic elixir that addresses pain through serotonine release, reduces stress and improves the quality of sleep. Migraine,  cervicogenic  headache, post-concussion recovery, pregnancy, weight management, they all benefit from exercise. So let's get started and feel better today, every day!    (12) Neuromodulation with medical devices for headache (by Genevieve Norlander PA-C)  Neuromodulation with Cefaly - Success in healing headaches: a multimodal headache management is more efficient than medications alone. Part of this treatment is neuromodulation with medical devices designed to treat headaches. Let's explore this avenue, controlling pain and brain function with this safe non-medication option.        MON TUE WED THU FRI   A.M. --- --- --- --- ---   P.M.  1:00 2, 7, 8   Dr. Berneta Sages    11, 12  Undra Trembath PA-C 1, 2, 3, 4, 5, 6  Dr. Gean Maidens Murinova  9, 10  Mui Chan-Goh ARNP 1, 2, 3, 4, 5, 6   Dr. Gean Maidens Murinova          4. Follow up with Genevieve Norlander PA-C in 4 weeks or sooner if any new problems related to headache management arise.    ====================    We continue to recommend a multimodal approach to headache management. Your participation in your care is immensely important and we are excited about the work you have done so far to improve your overall health and headache symptoms.     Acute (abortive) medications: these medications are to be taken only at the onset of severe headache. Please limit the total of any combination of these medications to less than 6 days per month on average because they can cause rebound (medication-use) headaches.     Natural approaches: to increasing brain energy and serotonin:  . SAMe 200 mg a day  . Vitamin B2 400 mg daily  . Vitamin B12 1000 mcg daily  . Getting early morning light to increase natural serotonin, melatonin. Could also consider light therapy box ("happy lamp") - 10,000 LUX.     Supplements over-the counter for prevention and acute treatment of migraine  . Boswellia 800 mg 2-3 times per day, example brand Swanson's, natural anti inflammatory herb  . Sleep modulation- melatonin at night 5 to  10 mg 30 minutes prior to sleep  . Feverfew Tea 1-3 cups per day. Can get from buddhateas.com or tenzingmomo.com- A Natural anti inflammatory approach that does not cause further sensitization of the system.  . Magnesium Citrate 250 mg daily, slowly increase up to [phone] mg daily. Side effect may include diarrhea, so we recommend stopping at whatever dose is comfortable for your body without causing this side effect. This supplement can be very helpful for preventing headache, preventing migraine aura, calming nerves, muscles and even mood. Recommend starting slowly, as it can also lead to increased bowel movements or diarrhea.      General recommendations for lifestyle management of headaches:     Exercise  . Exercise routine should include both:  o Aerobic - like walking, swimming, jogging, biking, elliptical  o Anaerobic (muscle building) - like yoga, Pilates, Tai Chi, light weights or resistance bands  . Stay hydrated before and after exercise  . Have a healthy snack after exercise even if you do not feel hungry    Nutrition  . Home made bone broth  . Increase the protein intake in the morning (meat, dairy, soy, nuts, beans)   . Avoid foods with high glycemic index (soda and juice, sugars and sweeteners, processed carbohydrates, see www.glycemicindex.com to look up the foods you typically eat)  . Limit caffeine  to 1-2 cups of coffee or tea per day  . Book recommendation: The [address], by Johnney Ou is a good guideline to anti-inflammatory diet  . Consider attending the nutrition class with Dr. Misty Stanley to learn more about nutrition for headache prevention     Sleep  . Establish a regular bedtime and awakening time, even on nights where you didn't sleep well  . Keep daytime naps to one hour or less  . If you regularly use a computer, tablet, or smartphone in the evening, download a filter program that will dim the screen and filter out the blue light to prevent this from interfering with your sleep cycle  (examples: Twilight app on Android phone, Night Shift on iPhone, F.lux on Windows computer)  . Melatonin 3-10 mg (or Melatonin with L-theanene) taken 30 minutes before bedtime helps prepare the brain for sleep and can help prevent migraines as well  . Normalize the day/night rhythm of the brain by getting outside in the morning for natural sunlight exposure, or use a sun lamp 10,000 LUX for at least 15-30 minutes first thing in the morning (can be bought on [employer].com for ~$40)  . Consider attending the sleep class with Valetta Mole, ARNP, to learn about strategies for improving your sleep quality     Here are a few tips to improve your healthcare:     Please fill out your [employer] survey prior to each visit.     Marland Kitchen Refills: Call your pharmacy at least 7 working days before you run out. Please do not call the clinic for refills, it is quicker and safer to go through your pharmacy. We usually prescribe the medication for one year. If you continue to receive the prescription from [place], we require at least one yearly visit for refills. Please schedule your appointment one month prior to running out.     . Test Results: Available in 1-2 weeks on eCare. We will contact you sooner by telephone if there is something more urgent.     . Urgent Symptoms: Call [phone], day or night, and select option 8. Our clinic staff will help you during regular hours; after hours, [place] [facility] [place] on-call nurses will help you. If life-threatening, please call 911.      . Other Questions: Use eCare to securely e-mail [place]. If you have a long or complex question or a new issue, please make an appointment, call [phone], day or night, and select option 8. To sign up for eCare call [phone] or ask your medical assistant to sign you up when in clinic.       Headache Clinic Policies:     - You need be seen at least once a year to continue safely receiving prescriptions from [place].  - We do not coordinate disability or social  security, however we can provide our note to your primary care provider to help you coordinate the process.  - We can assist with family medical leave paperwork (FMLA); filling out the paperwork requires a face-to-face office visit.   - Schedule appointments ahead of time; we do not have urgent, same-day appointments available.  - We do not prescribe opioids or other controlled substances aside from Lyrica.  - To assure best access to care for all of our patients, we require at least 48 hours notice prior to cancelling appointments.      Benefits, side effects, and alternatives were discussed.   The patient understands all the recommendations.     The  patient is agreeable with the plan.

## 2018-09-22 ENCOUNTER — Telehealth (HOSPITAL_BASED_OUTPATIENT_CLINIC_OR_DEPARTMENT_OTHER): Payer: Self-pay

## 2018-09-28 NOTE — Telephone Encounter (Signed)
Prior Authorization Request Pending    Prescription Insurance: [place]: [facility] #[id number] [phone] [place] [place] [place] [phone]    Medication: Roselyn Meier  [facility] Workflow Exemption Request: No  Date Submitted: 09/28/18    Type of Request:    [  ] Fax:     [X]  Telephone: Submitted via telephone call 09/28/18 @ [facility]    [  ] Other:     [  ] EPA:    [  ] Cover My Meds Key:     Please allow up to 3-5 business days for review/response.

## 2018-09-28 NOTE — Telephone Encounter (Signed)
Prior Authorization Denial    The patient's insurance has denied coverage of the following medication:    Prescription Insurance: [place]: [facility] #[id number] [place]. [address] [place] [id number]    Medication: Roselyn Meier  [facility] Workflow Exemption Request: No  Reason for Denial: Step Therapy  Denial # or code: N/A    Plan:      [X]  Recommend changing to an alternative formulary medication: DHE, Ergotamine-Caffeine, Diclofenac Tabs, Celecoxib, Naratriptan, Zolmitriptan and Naproxen   (The prescription can be entered using this current telephone encounter)     [X]  Other: Per the plan, patient must try/fail the above listed before coverage may be considered. To appeal, please call [phone] option 1, 1.      Additional Comments:  Denial letter has been scanned into Media.

## 2018-09-29 NOTE — Telephone Encounter (Signed)
Called the patient and LM that her insurance denied her Erica [name].  Also informed her that she can schedule a telemedicine appointment to discuss her treatment plan if she would like to.

## 2018-10-01 ENCOUNTER — Encounter (HOSPITAL_BASED_OUTPATIENT_CLINIC_OR_DEPARTMENT_OTHER): Payer: Self-pay | Admitting: Physician Assistant

## 2018-10-01 ENCOUNTER — Ambulatory Visit: Payer: Commercial Managed Care - PPO | Attending: Physician Assistant | Admitting: Physician Assistant

## 2018-10-01 DIAGNOSIS — G43701 Chronic migraine without aura, not intractable, with status migrainosus: Secondary | ICD-10-CM

## 2018-10-01 MED ORDER — ONABOTULINUMTOXINA 100 UNITS IJ SOLR
200.0000 [IU] | Freq: Once | INTRAMUSCULAR | Status: AC
Start: 2018-10-01 — End: 2018-10-01
  Administered 2018-10-01: 200 [IU]

## 2018-10-01 MED ORDER — KETOROLAC TROMETHAMINE 10 MG OR TABS
10.0000 mg | ORAL_TABLET | ORAL | 2 refills | Status: AC | PRN
Start: 2018-10-01 — End: ?

## 2018-10-01 NOTE — Progress Notes (Signed)
NEUROLOGY CLINIC NOTE  Erica [name] comes today for botox injection for chronic migraine.   We received approval for Botox.     Payor: [facility] [place] [facility] / Plan: [facility] [place] OPTIONS ACCESS PPO / Product Type: PPO    Headache interval history:  Erica [name] is a very pleasant 33 year old who was first seen in clinic 12/03/2015 by Dr. Geanie Kenning for initial consult visit.   - 12/03/2015 - Initial visit (by Dr. Geanie Kenning)  - 12/24/2016 - Botox #1 (by Clayton Lefort ARNP)  - 04/02/2016 - Botox #2 - after treatment she had 3 weeks without migraines (by [place])  - 04/17/2016 - Follow up visit (by Dr. Geanie Kenning)    - 06/26/16 - Botox #3 - Mild  "droopy" eyelids after botox # 2 - injected higher up in frontalis with no further side effect  - 06/26/16 - Follow up visit (by Dr. Geanie Kenning)   - 09/23/2016 - Botox #4 (by Clayton Lefort ARNP)  - 01/28/2017 - Botox #5 (by Dorrene German PA-C)   - 12/21/2017 - LV one year ago - Follow up visit and Cymbalta taper (by Dorrene German PA-C)   - 10/01/2018 - Botox #6 (by Dorrene German PA-C) Today     Erica [name] has a diagnosis of (G43.701) Chronic migraine without aura with status migrainosus, not intractable.    ========================================    BMI: 25.32 / 26.50 / 27.29 (today)  What is your goal of this visit? Botox   How are you doing overall? Erica [name] with Botox     Steps-to-Success shared visit: NO  Biofeedback shared visit: NO  Nutrition shared visit: NO  Sleep shared visit: NO    Cefaly for neuromodulation  - attended training: NO  - currently using: NO    GammaCore Sapphire for vagal nerve stimulation:   - attended training: NO  - currently using: NO    ========================================    Improvement with Botox treatment: 50%, reducing headache intensity, frequency and duration.  Her last Botox treatment was in 2018, she felt better and she stopped. Now the headaches are coming back.     CURRENT MEDICATION LIST:  PREVENTIVE MEDICATIONS (currently  taking):  - Duloxetine (Cymbalta)     ACUTE MEDICATIONS (currently taking):    - Ibuprofen (Motrin/Advil) x5 [name] a month in July 2020  - Sumatriptan (Imitrex/Treximet) x5 [name] a month in July 2020  - Marijuana, CBD/THC x30 [name] a month in July 2020    SUPPLEMENTS (currently taking):    - Magnesium    - Vitamin B12  - Feverfew   - Butterbur  - Melatonin   - CoQ10    Headache features:  Frequency: 20 headache [name] per month, with 10-15 [name] of migraines  Intensity: from 7 to 9/10  Duration: from 8 hours to 2 [name]    Character:   - Pulsating or throbbing   - Pressure   - Tension   - Sharp  - Stabbing     Location:   - Forehead   - Temple   - Back of the head   - Neck     Laterality:   - right side 80%   - left side 20%  - changes sides     Vision changes:   - None     Aggravating factors:   - bright light or glare  - loud sounds  - strong smells  - physical exertion   - computer work  -  mental exertion  - being at work     Alleviating factors:  - Ice pack on the neck  - meditation  - laying down in dark, cool room  - Imitrex  - cannabis  - caffeine    From LV notes on 12/21/2017 by myself:  ACUTE MEDICATIONS   Currently taking:  - Sumatriptan tablet 4 [name] in Sept.   - Ibuprofen 2 [name] in Sept.   - Canabis smoke, or tincture or vappe - every day     Tried in the past:  - Ketorolac - she ran out 6 months ago, when she last took it.     PREVENTIVE MEDICATIONS FOR HEADACHE  Currently taking:  - Duloxetine 60 mg daily, for the last 1-2 years - she would like to get off this medication, thinking about getting pregnant in 1-2 years from now.     SUPPLEMENTS:  Currently taking:  - melatonin 3 mg at night   - Vitamin D3 3,000 IU      HISTORY OF PRESENT ILLNESS   Erica [name] is a very pleasant 33 year old years old female here today for follow up visit.   She reports the following features of her headaches:    - FREQUENCY (HA [name]/month): 4-5 [name] in the last 30 [name] - in Sept.  - AVERAGE INTENSITY: 5-6/10 in  the last 30 [name] -  in Sept.  - DURATION: 3-4 hours the HA itself, but including the prodrome and postdrome it will last up to all day.     She also had 1 migraine day 8-9/10 in the last 30 [name] - in Sept.  She describes the worse migraine waking her up in the morning.      Important Limitations   Safety and effectiveness have not been established for the prophylaxis of episodic migraine (14 headache [name] or fewer per month) in 7 placebo-controlled studies.   IMPORTANT SAFETY INFORMATION, INCLUDING BOXED WARNING   Distant Spread of Toxin Effect: Post marketing reports indicate that the effects of BOTOX and all botulinum toxin products may spread from the area of injection to produce symptoms consistent with botulinum toxin effects. These may include asthenia, generalized muscle weakness, diplopia, ptosis, dysphagia, dysphonia, dysarthria, urinary incontinence, and breathing difficulties. These symptoms have been reported hours to weeks after injection. Swallowing and breathing difficulties can be life threatening, and there have been reports of death. The risk of symptoms is probably greatest in children treated for spasticity, but symptoms can also occur in adults treated for spasticity and other conditions, particularly in those patients who have underlying conditions that would predispose them to these symptoms. In unapproved uses, including spasticity in children, and in approved indications, cases of spread of effect have been reported at doses comparable to those used to treat cervical dystonia and at lower doses.     BOTOX was evaluated in 2 randomized, multicenter, 24-week, 2-injection-cycle, placebo controlled double-blind studies. Study 1 and study 2 included Chronic Migraine adults (G=[id number]) who were not using any concurrent headache prophylaxis, and during a 28-day baseline period had > 15 headache [name] lasting 4 hours or more, with > 50% being migraine/probable migraine.1 The most frequently reported  adverse reactions following injection of BOTOX for Chronic Migraine include neck pain (9%), headache (5%), eyelid ptosis (4%), migraine (4%), muscular weakness (4%), musculoskeletal stiffness (4%), bronchitis (3%), injection-site pain (3%), musculoskeletal pain (3%), myalgia (3%), facial paresis (2%), hypertension (2%), and muscle spasms (2%).   I request confirmation that this  therapy is a covered benefit based on medical necessity and that associated fees will be covered. Thank you for your review of this information and for your coverage consideration. If you have any questions or require additional information, please contact me.     IMPORTANT SAFETY INFORMATION   CONTRAINDICATIONS: BOTOX is contraindicated in the presence of infection at the proposed injection site(s) and in individuals with known hypersensitivity to any botulinum toxin preparation or to any of the components in the formulation.   Hypersensitivity Reactions :Serious and/or immediate hypersensitivity reactions have been reported. These reactions include anaphylaxis, serum sickness, urticaria, soft-tissue edema, and dyspnea. If such a reaction occurs, further injection of BOTOX should be discontinued and appropriate medical therapy immediately instituted. One fatal case of anaphylaxis has been reported in which lidocaine was used as the diluent, and consequently the causal agent cannot be reliably determined.   Chronic Migraine:The most frequently reported adverse reactions following injection of BOTOX for chronic migraine include neck pain (9%), headache (5%), eyelid ptosis (4%), migraine (4%), muscular weakness (4%), musculoskeletal stiffness (4%), bronchitis (3%), injection-site pain (3%), musculoskeletal pain (3%), myalgia (3%), facial paresis (2%), hypertension (2%), and muscle spasms (2%).   Post Marketing Experience:There have been spontaneous reports of death, sometimes associated with dysphagia, pneumonia, and/or other significant  debility or anaphylaxis, after treatment with botulinum toxin. There have also been reports of adverse events involving the cardiovascular system, including arrhythmia and myocardial infarction, some with fatal outcomes. Some of these patients had risk factors including cardiovascular disease. The exact relationship of these events to the botulinum toxin injection has not been established.     OBJECTIVE:  BP 109/78   Pulse 98   Temp (!) 76 F (24.4 C) (Temporal)   Ht 5' 0.1" (1.527 m)   Wt 140 lb 3.2 oz (63.6 kg)   BMI 27.29 kg/m   Appearance: healthy, alert, no distress and cooperative.  Neurological Exam: Alert and oriented x 3.  Speech normal, Muscle tone and strength normal and symmetric, Reflexes normal and symmetric and Sensation grossly normal.    Pre procedure verification:  Verified allergies and relevant documentation, standard hand hygiene observed.     Procedures:   Z5638 - Botulinum Toxin A - Botox 200U   75643 - Chemodenervation of innervated by facial nerve  and chemodenervation of cervical spinal muscles   200U of BOTOX was given (155U injected + 45U wasted).     The risk and benefits of the procedure were explained and she agreed to have the procedure performed. Written consent form signed.       Assessment:   Diagnosis: (G43.701) Chronic migraine without aura with status migrainosus, not intractable    If you are covered by a commercial insurance and have a diagnosis of chronic migraine, the company could assist with your out-of-pocket co-payment up to $700 per treatment. In order to get assistance, please call [employer] at [phone] or go to the official website [email or web address].    Recommendations:   Botox was mixed 2cc sterile preservative free saline per vial. Patient signed the consent form. The risks and nature of the procedure were explained and She agreed to have the procedure performed. Patient should return in 3 months for evaluation.   Injection Summary   Patient  received 155U of BOTOX in 31 injections. The following muscles were injected:   Amount Muscle   5 U Proceros   5 U Corrugator Supercilii (left)   5 U Corrugator Supercilii (right)   10 U Epicranius -  Occipitofrontalis Venter Frontalis (right)   10 U Epicranius - Occipitofrontalis Venter Frontalis (left)   20 U Temporalis (right)   20 U Temporalis (left)   15 U Venter Occipitalis (right)   15 U Venter Occipitalis (left)   10 U Semispinalis capitis (left)   10 U Semispinalis capitis (right)   15 U Trapezius (right)   15 U Trapezius (left)   45 units wasted   The patient tolerated the BOTOX well with no side effects.           CLINICAL SUMMARY:  Erica [name] is coming today for Botox for medical necessity of treatment of chronic migraines.  Botox works well for her migraines.  We proceeded with Botox 155 units.    Plan to follow-up in 3 months for next Botox treatment or sooner PRN.

## 2018-10-01 NOTE — Patient Instructions (Signed)
Assessment:   Diagnosis: (G43.701) Chronic migraine without aura with status migrainosus, not intractable    If you are covered by a commercial insurance and have a diagnosis of chronic migraine, the company could assist with your out-of-pocket co-payment up to $700 per treatment. In order to get assistance, please call [employer] at [phone] or go to the official website [email or web address].    Recommendations:   Botox was mixed 2cc sterile preservative free saline per vial. Patient signed the consent form. The risks and nature of the procedure were explained and She agreed to have the procedure performed. Patient should return in 3 months for evaluation.   Injection Summary   Patient received 155U of BOTOX in 31 injections. The following muscles were injected:   Amount Muscle   5 U Proceros   5 U Corrugator Supercilii (left)   5 U Corrugator Supercilii (right)   10 U Epicranius - Occipitofrontalis Venter Frontalis (right)   10 U Epicranius - Occipitofrontalis Venter Frontalis (left)   20 U Temporalis (right)   20 U Temporalis (left)   15 U Venter Occipitalis (right)   15 U Venter Occipitalis (left)   10 U Semispinalis capitis (left)   10 U Semispinalis capitis (right)   15 U Trapezius (right)   15 U Trapezius (left)   45 units wasted   The patient tolerated the BOTOX well with no side effects.           CLINICAL SUMMARY:  Erica [name] is coming today for Botox for medical necessity of treatment of chronic migraines.  Botox works well for her migraines.  We proceeded with Botox 155 units.    Plan to follow-up in 3 months for next Botox treatment or sooner PRN.

## 2018-10-15 ENCOUNTER — Encounter (HOSPITAL_BASED_OUTPATIENT_CLINIC_OR_DEPARTMENT_OTHER): Payer: Self-pay

## 2018-10-18 ENCOUNTER — Ambulatory Visit: Payer: Commercial Managed Care - PPO | Admitting: Physician Assistant

## 2018-10-18 DIAGNOSIS — G43701 Chronic migraine without aura, not intractable, with status migrainosus: Secondary | ICD-10-CM

## 2018-10-18 NOTE — Progress Notes (Signed)
ENCOUNTER OPENED IN ERROR [ERROR]  ENCOUNTER OPENED IN ERROR [ERROR]  ENCOUNTER OPENED IN ERROR [ERROR]  ++++++++++++++++++++++++++++++++++++++++++++

## 2018-10-26 ENCOUNTER — Ambulatory Visit: Payer: Commercial Managed Care - PPO | Attending: Neurology | Admitting: Neurology

## 2018-10-26 DIAGNOSIS — G43701 Chronic migraine without aura, not intractable, with status migrainosus: Secondary | ICD-10-CM | POA: Insufficient documentation

## 2018-10-26 NOTE — Progress Notes (Signed)
[name] HEADACHE CLINIC SHARED MEDICAL FOLLOW-UP VISIT    IDENTIFICATION DATA/CHIEF COMPLAINT:  Erica [name] is a very pleasant 33 year old with diagnosis of (G43.701) Chronic migraine without aura with status migrainosus, not intractable  (primary encounter diagnosis). Here today for headache management shared medical follow up visit with focus on nutrition.       HPI:  PREVENTIVE MEDICATIONS (currently taking):  - Duloxetine (Cymbalta)     ACUTE MEDICATIONS (currently taking):               - Ibuprofen (Motrin/Advil) x5 [name] a month in July 2020  - Sumatriptan (Imitrex/Treximet) x5 [name] a month in July 2020  - Marijuana, CBD/THC x30 [name] a month in July 2020    SUPPLEMENTS (currently taking):               - Magnesium    - Vitamin B12  - Feverfew   - Butterbur  - Melatonin       - CoQ10    Headache features:  Frequency: 20 headache [name] per month, with 10-15 [name] of migraines  Intensity: from 7 to 9/10  Duration: from 8 hours to 2 [name]    Character:   - Pulsating or throbbing   - Pressure   - Tension   - Sharp  - Stabbing     Location:   - Forehead   - Temple   - Back of the head   - Neck     Laterality:   - right side 80%   - left side 20%  - changes sides     Vision changes:   - None     Aggravating factors:   - bright light or glare  - loud sounds  - strong smells  - physical exertion   - computer work  - mental exertion  - being at work     Alleviating factors:  - Ice pack on the neck  - meditation  - laying down in dark, cool room  - Imitrex  - cannabis  - caffeine    From LV notes on 12/21/2017 by myself:  ACUTE MEDICATIONS   Currently taking:  - Sumatriptan tablet 4 [name] in Sept.   - Ibuprofen 2 [name] in Sept.   - Canabis smoke, or tincture or vappe - every day     Tried in the past:  - Ketorolac - she ran out 6 months ago, when she last took it.     PREVENTIVE MEDICATIONS FOR HEADACHE  Currently taking:  - Duloxetine 60 mg daily, for the last 1-2 years - she would like to get off this  medication, thinking about getting pregnant in 1-2 years from now.     SUPPLEMENTS:  Currently taking:  - melatonin 3 mg at night   - Vitamin D3 3,000 IU      Review of Systems -   See [employer] survey that has been uploaded and attached to this EPIC chart note    Outpatient Medications Prior to Visit   Medication Sig Dispense Refill   . Cholecalciferol (VITAMIN D3) 1000 units Oral Tab Take 1,000 Units by mouth daily.     . DULoxetine 20 MG Oral CAPSULE ENTERIC COATED PARTICLES Take 1 capsule (20 mg) by mouth 2 times a day. 60 capsule 11   . ketorolac 10 MG tablet Take 1 tablet (10 mg) by mouth every 4 hours as needed (At the onset of a migraine). 10 tablet 2   . Melatonin 5  MG Oral Tab Take by mouth at bedtime.     . Ondansetron HCl 8 MG Oral Tab Take 1 tablet (8 mg) by mouth every 8 hours as needed for nausea/vomiting. 12 tablet 11   . predniSONE 10 MG tablet Take 1 tablet (10 mg) by mouth 2 times a day. 40 tablet 0   . SUMAtriptan Succinate 100 MG Oral Tab Take 1 tablet (100 mg) by mouth every 2 hours as needed for migraines. Take at onset of migraine. May repeat x 1 dose. Max 200 mg/24 hours 9 tablet 11   . ubrogepant (Ubrelvy) 50 MG tablet Take 1 tablet (50 mg) by mouth one time as needed for migraines. May repeat dose once in 2 hours if no relief. (Patient not taking: Reported on 10/01/2018) 10 tablet 11   . Unclassified (OTHER MEDS, SEE COMMENTS,) Cefaly device, use as directed (Patient not taking: Reported on 06/26/2016) 1 kit 0     No facility-administered medications prior to visit.        Allergies, Past Medical History, Problem List, Family history and Social history reviewed per EPIC.     Physical Exam:  There were no vitals taken for this visit.  General: No acute distress, well-nourished, well-groomed, bright affect   Head: Normocephalic, atraumatic, normal hair distribution  Eyes: Sclera white, anicteric, conjunctiva clear  Neuro: Balanced gait, oriented x3, follows conversation easily    Diagnostic  Studies/Lab:  None this visit    Assessment:  Erica [name] is a very pleasant 33 year old with diagnosis of (G43.701) Chronic migraine without aura with status migrainosus, not intractable  (primary encounter diagnosis)      Plan:   Erica [name] participated today in a shared medical visit during which we had a group discussion, utilized power point presentation, handout and instructions. Content included but was not limited to:      1. Antiinflammatory diet: Decreasing sugar, increasing fruits and bright green vegetables.  Lean meats, fish or nuts for protein- 3oz three times a week. Example: [address]    2. Role of the microbiome: The "microbiome" is the term we use to refer to the collection of the genomes of the microbes in our gut. The term "microbiota" is used to refer to the actual bacteria individually.  There are good bacteria such as the Lactobacillus Rhamnosus that lead to reducing inflammation and pain, increasing production of serotonin (anti pain neurotransmitter). And there are bad bacteria that lead to increased inflammation. Sugar and carbohydrates, certain medications (antibiotics, ibuprofen) can lead to reduction of good bacteria and an out of balance microbiome.     We recommend the multimodal approach for headache management. Your participation in your care is immensely important and we are excited about the work you have done so far to improve your overall health and headache symptoms.       A handout of the power point was provided.     Other resources to explore:  The Anti-Inflammatory Diet & Action Plans by Gilford Raid and Shayne Alken  The [address] by Margarito Liner on [facility]       Follow Up  As directed by ARNP and MD team, within one month.         Distant Site Telemedicine Encounter  I conducted this encounter from [facility] via secure, live, face-to-face video conference (Zoom, HIPAA-compliant) with Erica [name] who was at their home.  This encounter is  being conducted via telemedicine in order to comply with  restrictions on travel due to the COVID-19 pandemic, and to prevent the patient from potential exposure to the novel corona virus. Prior to the interview, the risks and benefits of telemedicine were discussed with the patient and verbal consent was obtained.  All participants were identified and introduced.    Total time I spent preparing for the visit, documenting the visit, and direct face to face (telehealth) time.   TOTAL TIME 40 minutes

## 2018-12-01 ENCOUNTER — Telehealth (HOSPITAL_BASED_OUTPATIENT_CLINIC_OR_DEPARTMENT_OTHER): Payer: Self-pay

## 2018-12-07 NOTE — Telephone Encounter (Signed)
In order for Roselyn Meier to be paid on the patients' insurance plan ([employer]), the patient must first try and fail ALL of the following medication(s):    DHE   Ergotamine-Caffeine  Diclofenac Tabs  Celecoxib  Naratriptan  Zolmitriptan  Naproxen    If the option(s) provided are not appropriate please provide clinical rationale so a prior authorization exception may be pursued.     Pharmacy: [facility] [id number] [place]. [address] [place] [id number] [phone] [id number]     Please see previous denial from 09/22/18 for additional information.

## 2018-12-07 NOTE — Telephone Encounter (Signed)
[employer]Care to pt regarding denial.

## 2018-12-24 ENCOUNTER — Ambulatory Visit: Payer: Commercial Managed Care - PPO | Attending: Physician Assistant | Admitting: Physician Assistant

## 2018-12-24 ENCOUNTER — Encounter (HOSPITAL_BASED_OUTPATIENT_CLINIC_OR_DEPARTMENT_OTHER): Payer: Self-pay | Admitting: Physician Assistant

## 2018-12-24 VITALS — BP 117/81 | HR 72 | Temp 97.7°F | Ht 60.1 in | Wt 140.2 lb

## 2018-12-24 DIAGNOSIS — G43701 Chronic migraine without aura, not intractable, with status migrainosus: Secondary | ICD-10-CM

## 2018-12-24 MED ORDER — ONABOTULINUMTOXINA 100 UNITS IJ SOLR
200.0000 [IU] | Freq: Once | INTRAMUSCULAR | Status: AC
Start: 2018-12-24 — End: 2018-12-24
  Administered 2018-12-24: 200 [IU]

## 2018-12-24 NOTE — Patient Instructions (Signed)
Assessment:   Diagnosis: (G43.701) Chronic migraine without aura with status migrainosus, not intractable  (primary encounter diagnosis)    If you are covered by a commercial insurance and have a diagnosis of chronic migraine, the company could assist with your out-of-pocket co-payment up to $700 per treatment. In order to get assistance, please call [employer] at [phone] or go to the official website [email or web address].    Recommendations:   Botox was mixed 2cc sterile preservative free saline per vial. Patient signed the consent form. The risks and nature of the procedure were explained and She agreed to have the procedure performed. Patient should return in 3 months for evaluation.   Injection Summary   Patient received 155U of BOTOX in 31 injections. The following muscles were injected:   Amount Muscle   5 U Proceros   5 U Corrugator Supercilii (left)   5 U Corrugator Supercilii (right)   10 U Epicranius - Occipitofrontalis Venter Frontalis (right)   10 U Epicranius - Occipitofrontalis Venter Frontalis (left)   20 U Temporalis (right)   20 U Temporalis (left)   15 U Venter Occipitalis (right)   15 U Venter Occipitalis (left)   10 U Semispinalis capitis (left)   10 U Semispinalis capitis (right)   15 U Trapezius (right)   15 U Trapezius (left)   45 units wasted   The patient tolerated the BOTOX well with no side effects.           CLINICAL SUMMARY:  Erica [name] is coming today for Botox for medical necessity of treatment of chronic migraines.  Botox works well for her migraines.  We proceeded with Botox 155 units.    Plan to follow-up in 3 months for next Botox treatment or sooner PRN.

## 2018-12-24 NOTE — Progress Notes (Signed)
NEUROLOGY CLINIC NOTE  Erica [name] comes today for botox injection for chronic migraine.   We received approval for Botox.     Payor: [facility] [place] [facility] / Plan: [facility] [place] OPTIONS ACCESS PPO / Product Type: PPO    Headache interval history:  Erica [name] is a very pleasant 33 year old who was first seen in clinic 12/03/2015 by Dr. Geanie Kenning for initial consult visit.   - 12/03/2015 - Initial visit (by Dr. Geanie Kenning)  - 12/24/2016 - Botox #1 (by Clayton Lefort ARNP)  - 04/02/2016 - Botox #2 - after treatment she had 3 weeks without migraines (by Clayton Lefort ARNP)  - 04/17/2016 - Follow up visit (by Dr. Geanie Kenning)    - 06/26/16 - Botox #3 - Mild  "droopy" eyelids after botox # 2 - injected higher up in frontalis with no further side effect  - 06/26/16 - Follow up visit (by Dr. Geanie Kenning)   - 09/23/2016 - Botox #4 (by Clayton Lefort ARNP)  - 01/28/2017 - Botox #5 (by Dorrene German PA-C)   - 12/21/2017 - LV one year ago - Follow up visit and Cymbalta taper (by Dorrene German PA-C)   - 10/01/2018 - Botox #6 -155U (by Dorrene German PA-C)   - 12/24/2018 - Botox #7 -155U (by Dorrene German PA-C) Today     Erica [name] has a diagnosis of (G43.701) Chronic migraine without aura with status migrainosus, not intractable  (primary encounter diagnosis).    ========================================    BMI: 25.32 / 26.50 / 27.29 (today)  What is your goal of this visit? Botox   How are you doing overall? Better with Botox     Attended trainings:  Steps-to-Success shared visit: NO  Biofeedback shared visit: NO  Nutrition shared visit: NO  Sleep shared visit: NO  Medication Overuse Headache: NO  Headache SOS/Bridge Therapy Options: NO  Newer Medications for Headache: NO  Good Night Sleep 1 of 2: NO  Better Night Sleep 2 of 2: NO  Living Well with Stress Series (CBT): NO  Exercise and headache: NO  Cefaly for neuromodulation: NO    Medical devices for headache treatment (neuromodulation):  CEFALY - discussed: NO; currently using:  NO  Nerivio - discussed: NO; currently using: NO  GammaCore - discussed: NO; currently using: NO  sTMS-mini - discussed: NO; currently using: NO    ========================================    Improvement with Botox treatment: 50%, reducing headache intensity and frequency. She resolvs the remaining [name] of headaches with Roselyn Meier.     CURRENT MEDICATION LIST:  PREVENTIVE MEDICATIONS (currently taking):  - None    ACUTE MEDICATIONS (currently taking):    - Ibuprofen (Motrin/Advil) x2-3 [name] a month in Sept 2020  - Ketorolac (Toradol/Sprix) tabs x1 day a month in Sept 2020  - UBRELVY x4-5 [name] a month in Sept 2020  - Marijuana, CBD/THC smoke or vape or tincture x every day at night    SUPPLEMENTS (currently taking):    MIGRATONE (includes: Magnesium, Riboflavin, Vitamin B12, Feverfew, Butterbur, Melatonin)  - CoQ10    Headache features:  Frequency: 7-10 headache [name] per month, with 4-7 [name] of migraines  Intensity: from 7 to 9/10  Duration: 3 hours with Roselyn Meier     Character:   - Pulsating or throbbing   - Tension   - Stabbing     Location:   - Eye   - [place]   - Back of the head   - Neck  Laterality:   - right side mostly  - changes sides     Vision changes:   - None     Aggravating factors:   - bright light or glare  - loud sounds  - strong smells  - physical exertion   - mental exertion    Alleviating factors:  -       From LV notes in July 2020 by myself:  Improvement with Botox treatment: 50%, reducing headache intensity, frequency and duration.  Her last Botox treatment was in 2018, she felt better and she stopped. Now the headaches are coming back.     CURRENT MEDICATION LIST:  PREVENTIVE MEDICATIONS (currently taking):  - Duloxetine (Cymbalta)     ACUTE MEDICATIONS (currently taking):               - Ibuprofen (Motrin/Advil) x5 [name] a month in July 2020  - Sumatriptan (Imitrex/Treximet) x5 [name] a month in July 2020  - Marijuana, CBD/THC x30 [name] a month in July 2020    SUPPLEMENTS (currently  taking):               - Magnesium    - Vitamin B12  - Feverfew   - Butterbur  - Melatonin       - CoQ10    Headache features:  Frequency: 20 headache [name] per month, with 10-15 [name] of migraines  Intensity: from 7 to 9/10  Duration: from 8 hours to 2 [name]    Character:   - Pulsating or throbbing   - Pressure   - Tension   - Sharp  - Stabbing     Location:   - Forehead   - [place]   - Back of the head   - Neck     Laterality:   - right side 80%   - left side 20%  - changes sides     Vision changes:   - None     Aggravating factors:   - bright light or glare  - loud sounds  - strong smells  - physical exertion   - computer work  - mental exertion  - being at work     Alleviating factors:  - Ice pack on the neck  - meditation  - laying down in dark, cool room  - Imitrex  - cannabis  - caffeine      Important Limitations   Safety and effectiveness have not been established for the prophylaxis of episodic migraine (14 headache [name] or fewer per month) in 7 placebo-controlled studies.   IMPORTANT SAFETY INFORMATION, INCLUDING BOXED WARNING   Distant Spread of Toxin Effect: Post marketing reports indicate that the effects of BOTOX and all botulinum toxin products may spread from the area of injection to produce symptoms consistent with botulinum toxin effects. These may include asthenia, generalized muscle weakness, diplopia, ptosis, dysphagia, dysphonia, dysarthria, urinary incontinence, and breathing difficulties. These symptoms have been reported hours to weeks after injection. Swallowing and breathing difficulties can be life threatening, and there have been reports of death. The risk of symptoms is probably greatest in children treated for spasticity, but symptoms can also occur in adults treated for spasticity and other conditions, particularly in those patients who have underlying conditions that would predispose them to these symptoms. In unapproved uses, including spasticity in children, and in approved  indications, cases of spread of effect have been reported at doses comparable to those used to treat cervical dystonia and at lower doses.  BOTOX was evaluated in 2 randomized, multicenter, 24-week, 2-injection-cycle, placebo controlled double-blind studies. Study 1 and study 2 included Chronic Migraine adults [id number]) who were not using any concurrent headache prophylaxis, and during a 28-day baseline period had > 15 headache [name] lasting 4 hours or more, with > 50% being migraine/probable migraine.1 The most frequently reported adverse reactions following injection of BOTOX for Chronic Migraine include neck pain (9%), headache (5%), eyelid ptosis (4%), migraine (4%), muscular weakness (4%), musculoskeletal stiffness (4%), bronchitis (3%), injection-site pain (3%), musculoskeletal pain (3%), myalgia (3%), facial paresis (2%), hypertension (2%), and muscle spasms (2%).   I request confirmation that this therapy is a covered benefit based on medical necessity and that associated fees will be covered. Thank you for your review of this information and for your coverage consideration. If you have any questions or require additional information, please contact me.     IMPORTANT SAFETY INFORMATION   CONTRAINDICATIONS: BOTOX is contraindicated in the presence of infection at the proposed injection site(s) and in individuals with known hypersensitivity to any botulinum toxin preparation or to any of the components in the formulation.   Hypersensitivity Reactions :Serious and/or immediate hypersensitivity reactions have been reported. These reactions include anaphylaxis, serum sickness, urticaria, soft-tissue edema, and dyspnea. If such a reaction occurs, further injection of BOTOX should be discontinued and appropriate medical therapy immediately instituted. One fatal case of anaphylaxis has been reported in which lidocaine was used as the diluent, and consequently the causal agent cannot be reliably determined.    Chronic Migraine:The most frequently reported adverse reactions following injection of BOTOX for chronic migraine include neck pain (9%), headache (5%), eyelid ptosis (4%), migraine (4%), muscular weakness (4%), musculoskeletal stiffness (4%), bronchitis (3%), injection-site pain (3%), musculoskeletal pain (3%), myalgia (3%), facial paresis (2%), hypertension (2%), and muscle spasms (2%).   Post Marketing Experience:There have been spontaneous reports of death, sometimes associated with dysphagia, pneumonia, and/or other significant debility or anaphylaxis, after treatment with botulinum toxin. There have also been reports of adverse events involving the cardiovascular system, including arrhythmia and myocardial infarction, some with fatal outcomes. Some of these patients had risk factors including cardiovascular disease. The exact relationship of these events to the botulinum toxin injection has not been established.     OBJECTIVE:  BP 117/81   Pulse 72   Temp 97.7 F (36.5 C) (Temporal)   Ht 5' 0.1" (1.527 m)   Wt 140 lb 3.2 oz (63.6 kg) Comment: per chart  BMI 27.29 kg/m   Appearance: healthy, alert, no distress and cooperative.  Neurological Exam: Alert and oriented x 3.  Speech normal, Muscle tone and strength normal and symmetric, Reflexes normal and symmetric and Sensation grossly normal.    Pre procedure verification:  Verified allergies and relevant documentation, standard hand hygiene observed.     Procedures:   [id number] - Botulinum Toxin A - Botox 200U   [id number] - Chemodenervation of innervated by facial nerve  and chemodenervation of cervical spinal muscles   200U of BOTOX was given (155U injected + 45U wasted).     The risk and benefits of the procedure were explained and she agreed to have the procedure performed. Written consent form signed.       Assessment:   Diagnosis: (G43.701) Chronic migraine without aura with status migrainosus, not intractable  (primary encounter diagnosis)    If you are  covered by a commercial insurance and have a diagnosis of chronic migraine, the company could assist with your out-of-pocket co-payment up  to $700 per treatment. In order to get assistance, please call [employer] at [phone] or go to the official website [email or web address].    Recommendations:   Botox was mixed 2cc sterile preservative free saline per vial. Patient signed the consent form. The risks and nature of the procedure were explained and She agreed to have the procedure performed. Patient should return in 3 months for evaluation.   Injection Summary   Patient received 155U of BOTOX in 31 injections. The following muscles were injected:   Amount Muscle   5 U Proceros   5 U Corrugator Supercilii (left)   5 U Corrugator Supercilii (right)   10 U Epicranius - Occipitofrontalis Venter Frontalis (right)   10 U Epicranius - Occipitofrontalis Venter Frontalis (left)   20 U Temporalis (right)   20 U Temporalis (left)   15 U Venter Occipitalis (right)   15 U Venter Occipitalis (left)   10 U Semispinalis capitis (left)   10 U Semispinalis capitis (right)   15 U Trapezius (right)   15 U Trapezius (left)   45 units wasted   The patient tolerated the BOTOX well with no side effects.           CLINICAL SUMMARY:  Erica [name] is coming today for Botox for medical necessity of treatment of chronic migraines.  Botox works well for her migraines.  We proceeded with Botox 155 units.    Plan to follow-up in 3 months for next Botox treatment or sooner PRN.

## 2019-03-02 ENCOUNTER — Telehealth (HOSPITAL_BASED_OUTPATIENT_CLINIC_OR_DEPARTMENT_OTHER): Payer: Self-pay | Admitting: Physician Assistant

## 2019-03-02 NOTE — Telephone Encounter (Signed)
RETURN CALL: Voicemail - Detailed Message      SUBJECT:  Medication Questions     NAME OF MEDICATION(S): ubrogepant (Ubrelvy) 50 MG tablet  CONCERNS/QUESTIONS: insurance needs prior authorization  ADDITIONAL INFORMATION: please contact patient when done thank you

## 2019-03-02 NOTE — Telephone Encounter (Signed)
Caller: Erica [name]  Relationship: Self  Phone: [phone]  Time: 03/02/2019; 4:13 PM    Reason for call: Erica [name] needs a refill of her ubrelvy prescription. She was informed by her pharmacy that she needs prior authorization. Erica [name] would like for a prior authorization request to be submitted.

## 2019-03-03 NOTE — Telephone Encounter (Signed)
Spoke to the patient explained she would have to try the other alternative medications per the request of her insurance. I expressed understanding that the Roselyn Meier worked great for, unfortunately her insurance will not cover the medication until she tries all the alternatives. Moving forward the patient would like to try one of the alternatives, if Mui ARNP could send a prescription to her [place] that would be great.     I will inform Mui ARNP about trying another medication. Also the patient will update Mui at her next botox if the new medication works.

## 2019-03-03 NOTE — Telephone Encounter (Signed)
Called patient no answer I left a detailed message informing her that we cannot place another PA for her Erica [name] medication.The last PA for Erica [name] was placed back on 12/01/2018, which the PA was denied by her insurance. Currently we are still in the window for the appeal of her medication. Her insurance would like her to try all fomulary alternatives first:     DHE  Ergotamine-Caffeine  Diclofenac Tabs  Celecoxib  Naratriptan  Zolmitriptan  Naproxen    Some of the options the patient can do is have a follow up telehealth appointment with Mui ARNP to discuss the alternative medications she can try. I will send the patient an ecare message.

## 2019-04-07 ENCOUNTER — Ambulatory Visit: Payer: Commercial Managed Care - PPO | Attending: Family | Admitting: Family

## 2019-04-07 VITALS — BP 108/75 | HR 80 | Temp 97.8°F

## 2019-04-07 DIAGNOSIS — G43701 Chronic migraine without aura, not intractable, with status migrainosus: Secondary | ICD-10-CM | POA: Insufficient documentation

## 2019-04-07 MED ORDER — ONABOTULINUMTOXINA 100 UNITS IJ SOLR
100.0000 [IU] | Freq: Once | INTRAMUSCULAR | Status: AC
Start: 2019-04-07 — End: 2019-04-07
  Administered 2019-04-07: 200 [IU]

## 2019-04-07 NOTE — Progress Notes (Signed)
NEUROLOGY CLINIC NOTE  Erica [name] comes today for botox injection for chronic migraine.   We received approval for Botox.     [place] [employer] [place] [facility] / Plan: [employer] [facility] [place] [employer]ESS PPO / Product Type: PPO    Headache interval history:  Erica [name] is a very pleasant 34 year old who was first seen in clinic 12/03/2015 by Dr. Geanie Kenning for initial consult visit.   - 12/03/2015 - Initial visit (by Dr. Geanie Kenning)  - 12/24/2016 - Botox #1 (by Clayton Lefort ARNP)  - 04/02/2016 - Botox #2 - after treatment she had 3 weeks without migraines (by Clayton Lefort ARNP)  - 04/17/2016 - Follow up visit (by Dr. Geanie Kenning)    - 06/26/16 - Botox #3 - Mild  "droopy" eyelids after botox # 2 - injected higher up in frontalis with no further side effect  - 06/26/16 - Follow up visit (by Dr. Geanie Kenning)   - 09/23/2016 - Botox #4 (by Clayton Lefort ARNP)  - 01/28/2017 - Botox #5 (by Dorrene German PA-C)   - 12/21/2017 - LV one year ago - Follow up visit and Cymbalta taper (by Dorrene German PA-C)   - 10/01/2018 - Botox #6 -155U (by Dorrene German PA-C)   - 12/24/2018 - Botox #7 -155U (by Dorrene German PA-C)   -04/07/19- Botox # 8 ( Mui Chan-Goh, ARNP) today    Erica [name] has a diagnosis of (G43.701) Chronic migraine without aura with status migrainosus, not intractable  (primary encounter diagnosis).      Improvement with Botox treatment: 50%, reducing headache intensity and frequency.   Headache features:  Frequency: varies from 1 migraine a month to 2-3 [name] a week  Intensity: from 7 to 9/10  Duration: 3 hours with Erica [name] works well for acute treatment, 2-5 times a month   Takes Ibuprofen 2-3 times a month    Character:   - Pulsating or throbbing   - Tension   - Stabbing     Location:   - Eye   - [place]   - Back of the head   - Neck     Laterality:   - right side mostly  - changes sides       Aggravating factors:   - bright light or glare  - loud sounds  - strong smells  - physical exertion   - mental  exertion      From LV notes in July 2020 by Kristine Garbe :  Improvement with Botox treatment: 50%, reducing headache intensity, frequency and duration.  Her last Botox treatment was in 2018, she felt better and she stopped. Now the headaches are coming back.     CURRENT MEDICATION LIST:  PREVENTIVE MEDICATIONS (currently taking):  - Duloxetine (Cymbalta)     ACUTE MEDICATIONS (currently taking):               - Ibuprofen (Motrin/Advil) x5 [name] a month in July 2020  - Sumatriptan (Imitrex/Treximet) x5 [name] a month in July 2020  - Marijuana, CBD/THC x30 [name] a month in July 2020    SUPPLEMENTS (currently taking):               - Magnesium    - Vitamin B12  - Feverfew   - Butterbur  - Melatonin       - CoQ10    Headache features:  Frequency: 20 headache [name] per month, with 10-15 [name] of  migraines  Intensity: from 7 to 9/10  Duration: from 8 hours to 2 [name]    Character:   - Pulsating or throbbing   - Pressure   - Tension   - Sharp  - Stabbing     Location:   - Forehead   - [place]   - Back of the head   - Neck     Laterality:   - right side 80%   - left side 20%  - changes sides     Vision changes:   - None     Aggravating factors:   - bright light or glare  - loud sounds  - strong smells  - physical exertion   - computer work  - mental exertion  - being at work     Alleviating factors:  - Ice pack on the neck  - meditation  - laying down in dark, cool room  - Imitrex  - cannabis  - caffeine      Important Limitations   Safety and effectiveness have not been established for the prophylaxis of episodic migraine (14 headache [name] or fewer per month) in 7 placebo-controlled studies.   IMPORTANT SAFETY INFORMATION, INCLUDING BOXED WARNING   Distant Spread of Toxin Effect: Post marketing reports indicate that the effects of BOTOX and all botulinum toxin products may spread from the area of injection to produce symptoms consistent with botulinum toxin effects. These may include asthenia, generalized muscle weakness,  diplopia, ptosis, dysphagia, dysphonia, dysarthria, urinary incontinence, and breathing difficulties. These symptoms have been reported hours to weeks after injection. Swallowing and breathing difficulties can be life threatening, and there have been reports of death. The risk of symptoms is probably greatest in children treated for spasticity, but symptoms can also occur in adults treated for spasticity and other conditions, particularly in those patients who have underlying conditions that would predispose them to these symptoms. In unapproved uses, including spasticity in children, and in approved indications, cases of spread of effect have been reported at doses comparable to those used to treat cervical dystonia and at lower doses.     BOTOX was evaluated in 2 randomized, multicenter, 24-week, 2-injection-cycle, placebo controlled double-blind studies. Study 1 and study 2 included Chronic Migraine adults [id number]) who were not using any concurrent headache prophylaxis, and during a 28-day baseline period had > 15 headache [name] lasting 4 hours or more, with > 50% being migraine/probable migraine.1 The most frequently reported adverse reactions following injection of BOTOX for Chronic Migraine include neck pain (9%), headache (5%), eyelid ptosis (4%), migraine (4%), muscular weakness (4%), musculoskeletal stiffness (4%), bronchitis (3%), injection-site pain (3%), musculoskeletal pain (3%), myalgia (3%), facial paresis (2%), hypertension (2%), and muscle spasms (2%).   I request confirmation that this therapy is a covered benefit based on medical necessity and that associated fees will be covered. Thank you for your review of this information and for your coverage consideration. If you have any questions or require additional information, please contact me.     IMPORTANT SAFETY INFORMATION   CONTRAINDICATIONS: BOTOX is contraindicated in the presence of infection at the proposed injection site(s) and in  individuals with known hypersensitivity to any botulinum toxin preparation or to any of the components in the formulation.   Hypersensitivity Reactions :Serious and/or immediate hypersensitivity reactions have been reported. These reactions include anaphylaxis, serum sickness, urticaria, soft-tissue edema, and dyspnea. If such a reaction occurs, further injection of BOTOX should be discontinued and appropriate medical therapy immediately instituted. One  fatal case of anaphylaxis has been reported in which lidocaine was used as the diluent, and consequently the causal agent cannot be reliably determined.   Chronic Migraine:The most frequently reported adverse reactions following injection of BOTOX for chronic migraine include neck pain (9%), headache (5%), eyelid ptosis (4%), migraine (4%), muscular weakness (4%), musculoskeletal stiffness (4%), bronchitis (3%), injection-site pain (3%), musculoskeletal pain (3%), myalgia (3%), facial paresis (2%), hypertension (2%), and muscle spasms (2%).   Post Marketing Experience:There have been spontaneous reports of death, sometimes associated with dysphagia, pneumonia, and/or other significant debility or anaphylaxis, after treatment with botulinum toxin. There have also been reports of adverse events involving the cardiovascular system, including arrhythmia and myocardial infarction, some with fatal outcomes. Some of these patients had risk factors including cardiovascular disease. The exact relationship of these events to the botulinum toxin injection has not been established.     OBJECTIVE:  BP 108/75   Pulse 80   Temp 97.8 F (36.6 C) (Temporal)   SpO2 97%   Appearance: healthy, alert, no distress and cooperative.  Neurological Exam: Alert and oriented x 3.  Speech normal, Muscle tone and strength normal and symmetric, Reflexes normal and symmetric and Sensation grossly normal.    Pre procedure verification:  Verified allergies and relevant documentation, standard  hand hygiene observed.     Procedures:   [id number] - Botulinum Toxin A - Botox 200U   [id number] - Chemodenervation of innervated by facial nerve  and chemodenervation of cervical spinal muscles   200U of BOTOX was given (155U injected + 45U wasted).     The risk and benefits of the procedure were explained and she agreed to have the procedure performed. Written consent form signed.       Assessment:   Diagnosis: (G43.701) Chronic migraine without aura with status migrainosus, not intractable  (primary encounter diagnosis)       Recommendations:   Botox was mixed 2cc sterile preservative free saline per vial. Patient signed the consent form. The risks and nature of the procedure were explained and She agreed to have the procedure performed. Patient should return in 3 months for evaluation.   Injection Summary   Patient received 155U of BOTOX in 31 injections. The following muscles were injected:   Amount Muscle   5 U Proceros   5 U Corrugator Supercilii (left)   5 U Corrugator Supercilii (right)   10 U Epicranius - Occipitofrontalis Venter Frontalis (right) - higher up   10 U Epicranius - Occipitofrontalis Venter Frontalis (left) - higher up   20 U Temporalis (right)   20 U Temporalis (left)   15 U Venter Occipitalis (right)   15 U Venter Occipitalis (left)   10 U Semispinalis capitis (left)   10 U Semispinalis capitis (right)   15 U Trapezius (right)   15 U Trapezius (left)   45 units wasted   The patient tolerated the BOTOX well with no side effects.           CLINICAL SUMMARY:  Erica [name] is coming today for Botox for medical necessity of treatment of chronic migraines.  Botox works well for her migraines.  We proceeded with Botox 155 units.    Plan to follow-up in 3 months for next Botox treatment or sooner PRN.

## 2019-06-08 DIAGNOSIS — Z23 Encounter for immunization: Secondary | ICD-10-CM | POA: Diagnosis not present

## 2019-06-30 ENCOUNTER — Ambulatory Visit: Payer: Commercial Managed Care - PPO | Attending: Family | Admitting: Family

## 2019-06-30 ENCOUNTER — Encounter (HOSPITAL_BASED_OUTPATIENT_CLINIC_OR_DEPARTMENT_OTHER): Payer: Self-pay | Admitting: Family

## 2019-06-30 VITALS — BP 107/74 | HR 75 | Temp 97.9°F

## 2019-06-30 DIAGNOSIS — G43701 Chronic migraine without aura, not intractable, with status migrainosus: Secondary | ICD-10-CM

## 2019-06-30 MED ORDER — ONABOTULINUMTOXINA 100 UNITS IJ SOLR
200.0000 [IU] | Freq: Once | INTRAMUSCULAR | Status: AC
Start: 2019-06-30 — End: 2019-06-30
  Administered 2019-06-30: 155 [IU]

## 2019-06-30 NOTE — Progress Notes (Signed)
NEUROLOGY CLINIC PROCEDURE  NOTE  Erica [name] comes today for botox injection for chronic migraine.   We received approval for Botox.     Payor: [employer] [facility] [place] / Plan: [employer] [place] OPTIONS ACCESS PPO / Product Type: PPO    Headache interval history:  Erica [name] is a very pleasant 34 year old who was first seen in clinic 12/03/2015 by Dr. Geanie Kenning for initial consult visit.   - 12/03/2015 - Initial visit (by Dr. Geanie Kenning)  - 12/24/2016 - Botox #1 (by Clayton Lefort ARNP)  - 04/02/2016 - Botox #2 - after treatment she had 3 weeks without migraines (by Clayton Lefort ARNP)  - 04/17/2016 - Follow up visit (by Dr. Geanie Kenning)    - 06/26/16 - Botox #3 - Mild  "droopy" eyelids after botox # 2 - injected higher up in frontalis with no further side effect  - 06/26/16 - Follow up visit (by Dr. Geanie Kenning)   - 09/23/2016 - Botox #4 (by Clayton Lefort ARNP)  - 01/28/2017 - Botox #5 (by Dorrene German PA-C)   - 12/21/2017 - LV one year ago - Follow up visit and Cymbalta taper (by Dorrene German PA-C)   - 10/01/2018 - Botox #6 -155U (by Dorrene German PA-C)   - 12/24/2018 - Botox #7 -155U (by Dorrene German PA-C)   -04/07/19- Botox # 8 ( Mui Chan-Goh, ARNP)  -06/30/19- Botox # 8 ( Mui Chan-Goh, ARNP) today    Erica [name] has a diagnosis of (G43.701) Chronic migraine without aura with status migrainosus, not intractable  (primary encounter diagnosis).      Improvement with Botox treatment: 50%, reducing headache intensity and frequency.   She is using Ubrelvy 50 mg to 100 mg, finds it mostly effective. Currently using savings coupon. Her insurance denied coverage of ubrelvy. She is wondering about other alternative and the monthly antcgrp injectables     Headache features:  Frequency: varies from 1 migraine a month to 2-3 days a week  Intensity: from 7 to 9/10  Duration: 3 hours with Erica [name] works well for acute treatment, 2-5 times a month   Takes Ibuprofen 2-3 times a month    Character:   - Pulsating or throbbing   -  Tension   - Stabbing     Location:   - Eye   - Erica [name]   - Back of the head   - Neck     Laterality:   - right side mostly  - changes sides       Aggravating factors:   - bright light or glare  - loud sounds  - strong smells  - physical exertion   - mental exertion      From LV notes in July 2020 by Kristine Garbe :  Improvement with Botox treatment: 50%, reducing headache intensity, frequency and duration.  Her last Botox treatment was in 2018, she felt better and she stopped. Now the headaches are coming back.     CURRENT MEDICATION LIST:  PREVENTIVE MEDICATIONS (currently taking):  - Duloxetine (Cymbalta)     ACUTE MEDICATIONS (currently taking):               - Ibuprofen (Motrin/Advil) x5 days a month in July 2020  - Sumatriptan (Imitrex/Treximet) x5 days a month in July 2020  - Marijuana, CBD/THC x30 days a month in July 2020    SUPPLEMENTS (currently taking):               -  Magnesium    - Vitamin B12  - Feverfew   - Butterbur  - Melatonin       - CoQ10    Headache features:  Frequency: 20 headache days per month, with 10-15 days of migraines  Intensity: from 7 to 9/10  Duration: from 8 hours to 2 days    Character:   - Pulsating or throbbing   - Pressure   - Tension   - Sharp  - Stabbing     Location:   - Forehead   - [place]   - Back of the head   - Neck     Laterality:   - right side 80%   - left side 20%  - changes sides     Vision changes:   - None     Aggravating factors:   - bright light or glare  - loud sounds  - strong smells  - physical exertion   - computer work  - mental exertion  - being at work     Alleviating factors:  - Ice pack on the neck  - meditation  - laying down in dark, cool room  - Imitrex  - cannabis  - caffeine      Important Limitations   Safety and effectiveness have not been established for the prophylaxis of episodic migraine (14 headache days or fewer per month) in 7 placebo-controlled studies.   IMPORTANT SAFETY INFORMATION, INCLUDING BOXED WARNING   Distant Spread  of Toxin Effect: Post marketing reports indicate that the effects of BOTOX and all botulinum toxin products may spread from the area of injection to produce symptoms consistent with botulinum toxin effects. These may include asthenia, generalized muscle weakness, diplopia, ptosis, dysphagia, dysphonia, dysarthria, urinary incontinence, and breathing difficulties. These symptoms have been reported hours to weeks after injection. Swallowing and breathing difficulties can be life threatening, and there have been reports of death. The risk of symptoms is probably greatest in children treated for spasticity, but symptoms can also occur in adults treated for spasticity and other conditions, particularly in those patients who have underlying conditions that would predispose them to these symptoms. In unapproved uses, including spasticity in children, and in approved indications, cases of spread of effect have been reported at doses comparable to those used to treat cervical dystonia and at lower doses.     BOTOX was evaluated in 2 randomized, multicenter, 24-week, 2-injection-cycle, placebo controlled double-blind studies. Study 1 and study 2 included Chronic Migraine adults [id number]) who were not using any concurrent headache prophylaxis, and during a 28-day baseline period had > 15 headache days lasting 4 hours or more, with > 50% being migraine/probable migraine.1 The most frequently reported adverse reactions following injection of BOTOX for Chronic Migraine include neck pain (9%), headache (5%), eyelid ptosis (4%), migraine (4%), muscular weakness (4%), musculoskeletal stiffness (4%), bronchitis (3%), injection-site pain (3%), musculoskeletal pain (3%), myalgia (3%), facial paresis (2%), hypertension (2%), and muscle spasms (2%).   I request confirmation that this therapy is a covered benefit based on medical necessity and that associated fees will be covered. Thank you for your review of this information and for  your coverage consideration. If you have any questions or require additional information, please contact me.     IMPORTANT SAFETY INFORMATION   CONTRAINDICATIONS: BOTOX is contraindicated in the presence of infection at the proposed injection site(s) and in individuals with known hypersensitivity to any botulinum toxin preparation or to any of the components in the formulation.  Hypersensitivity Reactions :Serious and/or immediate hypersensitivity reactions have been reported. These reactions include anaphylaxis, serum sickness, urticaria, soft-tissue edema, and dyspnea. If such a reaction occurs, further injection of BOTOX should be discontinued and appropriate medical therapy immediately instituted. One fatal case of anaphylaxis has been reported in which lidocaine was used as the diluent, and consequently the causal agent cannot be reliably determined.   Chronic Migraine:The most frequently reported adverse reactions following injection of BOTOX for chronic migraine include neck pain (9%), headache (5%), eyelid ptosis (4%), migraine (4%), muscular weakness (4%), musculoskeletal stiffness (4%), bronchitis (3%), injection-site pain (3%), musculoskeletal pain (3%), myalgia (3%), facial paresis (2%), hypertension (2%), and muscle spasms (2%).   Post Marketing Experience:There have been spontaneous reports of death, sometimes associated with dysphagia, pneumonia, and/or other significant debility or anaphylaxis, after treatment with botulinum toxin. There have also been reports of adverse events involving the cardiovascular system, including arrhythmia and myocardial infarction, some with fatal outcomes. Some of these patients had risk factors including cardiovascular disease. The exact relationship of these events to the botulinum toxin injection has not been established.     OBJECTIVE:  BP 107/74   Pulse 75   Temp 36.6 C   SpO2 97%   Appearance: healthy, alert, no distress and cooperative.  Neurological  Exam: Alert and oriented x 3.  Speech normal, Muscle tone and strength normal and symmetric, Reflexes normal and symmetric and Sensation grossly normal.    Pre procedure verification:  Verified allergies and relevant documentation, standard hand hygiene observed.     Procedures:   [id number] - Botulinum Toxin A - Botox 200U   [id number] - Chemodenervation of innervated by facial nerve  and chemodenervation of cervical spinal muscles   200U of BOTOX was given (155U injected + 45U wasted).     The risk and benefits of the procedure were explained and she agreed to have the procedure performed. Written consent form signed.       Assessment:   Diagnosis: (G43.701) Chronic migraine without aura with status migrainosus, not intractable  (primary encounter diagnosis)     Recommendations:   Botox was mixed 2cc sterile preservative free saline per vial. Patient signed the consent form. The risks and nature of the procedure were explained and She agreed to have the procedure performed. Patient should return in 3 months for evaluation.   Injection Summary   Patient received 155U of BOTOX in 31 injections. The following muscles were injected:   Amount Muscle   5 U Proceros   5 U Corrugator Supercilii (left)   5 U Corrugator Supercilii (right)   10 U Epicranius - Occipitofrontalis Venter Frontalis (right) - higher up   10 U Epicranius - Occipitofrontalis Venter Frontalis (left) - higher up   20 U Temporalis (right)   20 U Temporalis (left)   15 U Venter Occipitalis (right)   15 U Venter Occipitalis (left)   10 U Semispinalis capitis (left)   10 U Semispinalis capitis (right)   15 U Trapezius (right)   15 U Trapezius (left)   45 units wasted   The patient tolerated the BOTOX well with no side effects.           CLINICAL SUMMARY:  Erica [name] is coming today for Botox for medical necessity of treatment of chronic migraines.  Botox works well for her migraines.  We proceeded with Botox 155 units.    Plan to follow-up in 3 months for next Botox  treatment or sooner PRN.  Please attend the NEWer medications zoom shared medical visit    Please follow up telemedicine    For acute treatment, can consider switching from ubrelvy to nurtec once savings coupon for [place] expires, or you can consider trial acute medications required by your insurance to trial

## 2019-07-06 DIAGNOSIS — Z23 Encounter for immunization: Secondary | ICD-10-CM | POA: Diagnosis not present

## 2019-07-22 ENCOUNTER — Telehealth (HOSPITAL_BASED_OUTPATIENT_CLINIC_OR_DEPARTMENT_OTHER): Payer: Self-pay

## 2019-07-25 NOTE — Telephone Encounter (Signed)
(  Roselyn Meier) 50 MG tablet is a non-formulary medication on the patients' insurance plan [employer]).     Please consider changing to:  Triptan therapy     Plan requests trial of 3 triptans before Roselyn Meier would be approved.    (Plan stated patient has documented trial of 2. (sumatriptan and rizatriptan)    (The prescription can be entered using this current telephone encounter)    Pharmacy: [facility] [phone] [facility] [phone]     If the option(s) provided are not are appropriate please provide clinical rationale so a prior authorization exception may be pursued.     Thank You,  Gaynelle Arabian, CPhT  [place]

## 2019-07-29 NOTE — Telephone Encounter (Signed)
Denial letter received in clinic and uploaded to media.  Snip included here for reference.

## 2019-09-26 ENCOUNTER — Other Ambulatory Visit (HOSPITAL_BASED_OUTPATIENT_CLINIC_OR_DEPARTMENT_OTHER): Payer: Self-pay | Admitting: Physician Assistant

## 2019-09-26 DIAGNOSIS — G43701 Chronic migraine without aura, not intractable, with status migrainosus: Secondary | ICD-10-CM

## 2019-09-27 DIAGNOSIS — Z01419 Encounter for gynecological examination (general) (routine) without abnormal findings: Secondary | ICD-10-CM | POA: Diagnosis not present

## 2019-09-28 MED ORDER — UBRELVY 50 MG OR TABS
ORAL_TABLET | ORAL | 2 refills | Status: AC
Start: 2019-09-28 — End: ?

## 2019-09-30 DIAGNOSIS — Z3042 Encounter for surveillance of injectable contraceptive: Secondary | ICD-10-CM | POA: Diagnosis not present

## 2019-10-27 ENCOUNTER — Other Ambulatory Visit: Payer: Self-pay

## 2019-10-27 ENCOUNTER — Emergency Department (HOSPITAL_COMMUNITY)
Admission: EM | Admit: 2019-10-27 | Discharge: 2019-10-27 | Disposition: A | Discharge status: Home / Self Care | Payer: Medicaid Other | Attending: Emergency Medicine | Admitting: Emergency Medicine

## 2019-10-27 ENCOUNTER — Emergency Department (HOSPITAL_COMMUNITY): Payer: Medicaid Other

## 2019-10-27 ENCOUNTER — Encounter (HOSPITAL_COMMUNITY): Payer: Self-pay

## 2019-10-27 DIAGNOSIS — M26629 Arthralgia of temporomandibular joint, unspecified side: Secondary | ICD-10-CM | POA: Insufficient documentation

## 2019-10-27 DIAGNOSIS — F17290 Nicotine dependence, other tobacco product, uncomplicated: Secondary | ICD-10-CM | POA: Insufficient documentation

## 2019-10-27 DIAGNOSIS — M25562 Pain in left knee: Secondary | ICD-10-CM | POA: Diagnosis not present

## 2019-10-27 DIAGNOSIS — M549 Dorsalgia, unspecified: Secondary | ICD-10-CM | POA: Insufficient documentation

## 2019-10-27 DIAGNOSIS — R2689 Other abnormalities of gait and mobility: Secondary | ICD-10-CM | POA: Diagnosis not present

## 2019-10-27 MED ORDER — KETOROLAC TROMETHAMINE 60 MG/2ML IM SOLN
60.0000 mg | Freq: Once | INTRAMUSCULAR | Status: AC
  Administered 2019-10-27: 60 mg via INTRAMUSCULAR
  Filled 2019-10-27: qty 2

## 2019-10-27 MED ORDER — IBUPROFEN 800 MG PO TABS: 800.0000 mg | ORAL_TABLET | Freq: Three times a day (TID) | ORAL | 0 refills | Status: AC

## 2019-10-27 NOTE — ED Provider Notes (Signed)
[facility] EMERGENCY DEPARTMENT Provider Note   CSN: [id number] Arrival date & time: 10/27/19  6734     History Chief Complaint  Patient presents with  . Knee Pain    Jennifer Pollard is a 34 y.o. female.  HPI 34 year old female presents with several day history of pain to the back of her left knee.  Does not recall any inciting injuries or falls, states she feels like she might of slept on her leg wrong.  Presented last year with similar complaint.  Did not follow-up with orthopedics.  She has not taken anything for her symptoms.  Has some pain with bending and standing.  Denies any erythema or warmth.  No history of gout.  No history of IV drug use or immunocompromise states.  No recent travel, not on oral contraceptives, no history of immune diseases, no chest pain, shortness of breath.  No fevers or chills.    History reviewed. No pertinent past medical history.  There are no problems to display for this patient.   History reviewed. No pertinent surgical history.   OB History    Gravida  1   Para  1   Term  1   Preterm      AB      Living  1     SAB      TAB      Ectopic      Multiple      Live Births              No family history on file.  Social History   Tobacco Use  . Smoking status: Current Every Day Smoker    Packs/day: 1.00    Types: Cigars  . Smokeless tobacco: Never Used  Vaping Use  . Vaping Use: Never used  Substance Use Topics  . Alcohol use: No  . Drug use: No    Home Medications Prior to Admission medications   Medication Sig Start Date End Date Taking? Authorizing Provider  acetaminophen (TYLENOL) 500 MG tablet Take 1,000 mg by mouth every 6 (six) hours as needed for moderate pain.    [provider]  cyclobenzaprine (FLEXERIL) 10 MG tablet Take 1 tablet (10 mg total) by mouth 3 (three) times daily as needed. 01/16/18   Triplett, Tammy, PA-C  diclofenac (VOLTAREN) 50 MG EC tablet Take 1 tablet (50 mg total) by  mouth 2 (two) times daily. 07/25/15   Fransico Meadow, PA-C  ibuprofen (ADVIL) 800 MG tablet Take 1 tablet (800 mg total) by mouth 3 (three) times daily. 10/27/19   Garald Balding, PA-C  traMADol Veatrice Bourbon) 50 MG tablet 1 or 2 po q6h prn pain 08/29/14   Lily Kocher, PA-C    Allergies    Patient has no known allergies.  Review of Systems   Review of Systems  Constitutional: Negative for chills and fever.  Musculoskeletal: Positive for arthralgias and gait problem. Negative for joint swelling.    Physical Exam Updated Vital Signs BP 132/89 (BP Location: Right Arm)   Pulse 74   Temp 98.4 F (36.9 C) (Oral)   Resp 18   Ht 5\' 4"  (1.626 m)   Wt 81.6 kg   SpO2 98%   BMI 30.90 kg/m   Physical Exam Vitals reviewed.  Constitutional:      General: She is not in acute distress.    Appearance: Normal appearance. She is not ill-appearing, toxic-appearing or diaphoretic.  HENT:     Head: Normocephalic and  atraumatic.  Eyes:     General:        Right eye: No discharge.        Left eye: No discharge.     Extraocular Movements: Extraocular movements intact.     Conjunctiva/sclera: Conjunctivae normal.  Musculoskeletal:        General: No swelling, tenderness or signs of injury. Normal range of motion.     Right lower leg: No edema.     Left lower leg: No edema.     Comments: Left knee without evidence of swelling, erythema, or warmth. ROM preserved but with pain. No TTP to the tibial plateau.  No visible erythema or swelling to the popliteal area.  No significant tenderness to palpation.   Neurological:     General: No focal deficit present.     Mental Status: She is alert and oriented to person, place, and time.  Psychiatric:        Mood and Affect: Mood normal.        Behavior: Behavior normal.     ED Results / Procedures / Treatments   Labs (all labs ordered are listed, but only abnormal results are displayed) Labs Reviewed - No data to display  EKG None  Radiology DG  Knee Complete 4 Views Left  Result Date: 10/27/2019 CLINICAL DATA:  Left knee pain for 2 days, no known injury EXAM: LEFT KNEE - COMPLETE 4+ VIEW COMPARISON:  05/13/2018 FINDINGS: No evidence of fracture, dislocation, or joint effusion. No evidence of arthropathy or other focal bone abnormality. Soft tissues are unremarkable. IMPRESSION: No fracture or dislocation of the left knee. Joint spaces are preserved. No knee joint effusion. Electronically Signed   By: Eddie Candle M.D.   On: 10/27/2019 08:07    Procedures Procedures (including critical care time)  Medications Ordered in ED Medications  ketorolac (TORADOL) injection 60 mg (60 mg Intramuscular Given 10/27/19 9924)    ED Course  I have reviewed the triage vital signs and the nursing notes.  Pertinent labs & imaging results that were available during my care of the patient were reviewed by me and considered in my medical decision making (see chart for details).    MDM Rules/Calculators/A&P                           Patient presents with left knee pain.  Full range of motion, however with pain.  Pt is without systemic symptoms, erythema or redness of the joint consistent with gout or septic joint.  Patient X-Ray negative for obvious fracture or dislocation.  Concern for DVT is low, patient has no risk factors, and there is no evidence of swelling or erythema. No CP or SOB.   Pain managed in ED. Pt advised to follow up with orthopedics if symptoms persist for further evaluation and treatment. Patient given  crutches while in ED, ibuprofen, knee exercises provided, recommended and discussed. Patient will be dc home & is agreeable with above plan Final Clinical Impression(s) / ED Diagnoses Final diagnoses:  Acute pain of left knee    Rx / DC Orders ED Discharge Orders         Ordered    ibuprofen (ADVIL) 800 MG tablet  3 times daily     Discontinue  Reprint     10/27/19 Otterbein, Sevyn Markham A, PA-C 10/27/19 1015      Maudie Flakes, MD 10/27/19  1530  

## 2019-10-27 NOTE — ED Triage Notes (Signed)
Pt presents to ED with complaints of left knee pain x 2 days. NKI

## 2019-10-27 NOTE — Discharge Instructions (Signed)
Please take ibuprofen up to 3 times daily for pain.  Apply heat or ice to your knee.  Use a sleeve and crutches as needed.  You may use the knee exercises for strengthening and rehabilitation.  Please make sure to follow-up with orthopedics for further evaluation as the treatment options here in the ER are limited.  Return to the ER if your symptoms worsen

## 2019-10-28 ENCOUNTER — Telehealth: Payer: Self-pay | Admitting: *Deleted

## 2019-10-28 NOTE — Telephone Encounter (Signed)
Contacted pt to complete transition of care assessment. Patient disconnected. Called patient back. Left number on voicemail. Medicaid Managed Care team Transition of Care Assessment outreach attempt #1 made today. Unable to reach patient. HIPPA compliant voice message left requesting a return call. The patient has also been enrolled in an automated discharge follow up call series and will receive two outreach attempts for transition of care assessment. Contact information has been left for the patient and the [facility] Managed Care team is available to provide assistance to the patient at any time.    Lenor Coffin, RN, BSN, [place] [facility] [place] [phone]

## 2019-11-02 ENCOUNTER — Telehealth: Payer: Self-pay | Admitting: *Deleted

## 2019-11-02 NOTE — Telephone Encounter (Signed)
Emailed patients information to [name] at [facility] to schedule NP appointment for patient .                                              Jennifer Pollard                                               PEC                                            [phone]

## 2019-12-07 ENCOUNTER — Ambulatory Visit: Payer: Medicaid Other | Admitting: Internal Medicine

## 2019-12-08 ENCOUNTER — Ambulatory Visit (INDEPENDENT_AMBULATORY_CARE_PROVIDER_SITE_OTHER): Payer: Medicaid Other | Admitting: Internal Medicine

## 2019-12-08 ENCOUNTER — Other Ambulatory Visit: Payer: Self-pay

## 2019-12-08 ENCOUNTER — Encounter: Payer: Self-pay | Admitting: Internal Medicine

## 2019-12-08 VITALS — BP 120/80 | HR 77 | Temp 97.1°F | Resp 18 | Ht 64.0 in | Wt 175.4 lb

## 2019-12-08 DIAGNOSIS — M222X9 Patellofemoral disorders, unspecified knee: Secondary | ICD-10-CM | POA: Insufficient documentation

## 2019-12-08 DIAGNOSIS — E669 Obesity, unspecified: Secondary | ICD-10-CM | POA: Diagnosis not present

## 2019-12-08 DIAGNOSIS — Z7689 Persons encountering health services in other specified circumstances: Secondary | ICD-10-CM

## 2019-12-08 DIAGNOSIS — Z23 Encounter for immunization: Secondary | ICD-10-CM | POA: Diagnosis not present

## 2019-12-08 DIAGNOSIS — Z6830 Body mass index (BMI) 30.0-30.9, adult: Secondary | ICD-10-CM

## 2019-12-08 DIAGNOSIS — M222X2 Patellofemoral disorders, left knee: Secondary | ICD-10-CM

## 2019-12-08 DIAGNOSIS — Z720 Tobacco use: Secondary | ICD-10-CM | POA: Insufficient documentation

## 2019-12-08 NOTE — Assessment & Plan Note (Signed)
Care established Previous chart reviewed History and medications reviewed with the patient 

## 2019-12-08 NOTE — Patient Instructions (Addendum)
Patellofemoral Pain Syndrome  Patellofemoral pain syndrome is a condition in which the tissue (cartilage) on the underside of the kneecap (patella) softens or breaks down. This causes pain in the front of the knee. The condition is also called runner's knee or chondromalacia patella. Patellofemoral pain syndrome is most common in young adults who are active in sports. The knee is the largest joint in the body. The patella covers the front of the knee and is attached to muscles above and below the knee. The underside of the patella is covered with a smooth type of cartilage (synovium). The smooth surface helps the patella to glide easily when you move your knee. Patellofemoral pain syndrome causes swelling in the joint linings and bone surfaces in the knee. What are the causes? This condition may be caused by:  Overuse of the knee.  Poor alignment of your knee joints.  Weak leg muscles.  A direct blow to your kneecap. How is this treated? This condition may be treated at home with rest, ice, compression, and elevation (RICE).  Other treatments may include:  Nonsteroidal anti-inflammatory drugs (NSAIDs).  Physical therapy to stretch and strengthen your leg muscles.  Shoe inserts (orthotics) to take stress off your knee.  A knee brace or knee support.  Adhesive tapes to the skin.  Surgery to remove damaged cartilage or move the patella to a better position. This is rare. Follow these instructions at home: If you have a shoe or brace:  Wear the shoe or brace as told by your health care provider. Remove it only as told by your health care provider.  Loosen the shoe or brace if your toes tingle, become numb, or turn cold and blue.  Keep the shoe or brace clean.  If the shoe or brace is not waterproof: ? Do not let it get wet. ? Cover it with a watertight covering when you take a bath or a shower. Managing pain, stiffness, and swelling  If directed, put ice on the painful  area. ? If you have a removable shoe or brace, remove it as told by your health care provider. ? Put ice in a plastic bag. ? Place a towel between your skin and the bag. ? Leave the ice on for 20 minutes, 2-3 times a day.  Move your toes often to avoid stiffness and to lessen swelling.  Rest your knee: ? Avoid activities that cause knee pain. ? When sitting or lying down, raise (elevate) the injured area above the level of your heart, whenever possible. General instructions  Take over-the-counter and prescription medicines only as told by your health care provider.  Use splints, braces, knee supports, or walking aids as directed by your health care provider.  Perform stretching and strengthening exercises as told by your health care provider or physical therapist.  Do not use any products that contain nicotine or tobacco, such as cigarettes and e-cigarettes. These can delay healing. If you need help quitting, ask your health care provider.  Return to your normal activities as told by your health care provider. Ask your health care provider what activities are safe for you.  Keep all follow-up visits as told by your health care provider. This is important. Contact a health care provider if:  Your symptoms get worse.  You are not improving with home care.   Please follow these exercises as discussed: [email or web address]

## 2019-12-08 NOTE — Assessment & Plan Note (Signed)
Had recent ER visit in 10/2019 X-ray knee showed no fracture or dislocation of the left knee. Joint spaces are preserved. No knee joint effusion. History points to pain similar to patellofemoral syndrome Advised knee exercises, material provided Advised weight loss Voltaren gel and/or Tylenol PRN

## 2019-12-08 NOTE — Assessment & Plan Note (Addendum)
Smokes 1 pack/day for about 15 years Discussed risks of smoking  Asked about quitting: confirms that currently smokes cigarettes Advise to quit smoking: Educated about QUITTING to reduce the risk of cancer, cardio and cerebrovascular disease. Assess willingness: Unwilling to quit at this time, but is working on cutting back. Assist with counseling and pharmacotherapy: Counseled for 5 minutes and literature provided. Arrange for follow up: Will follow up and continue to offer help.

## 2019-12-08 NOTE — Progress Notes (Addendum)
New Patient Office Visit  Subjective:  Patient ID: Jennifer Pollard, female    DOB: 04-08-1985  Age: 34 y.o. MRN: [id number]  CC:  Chief Complaint  Patient presents with   New Patient (Initial Visit)    new pt been awhile since pcp having trouble out of left knee it hurts and wont bend sometimes. Hospital told her it was inflammation.     HPI Jennifer Pollard is a 34 year old female with no significant medical history presents for establishing care. Patient states that she went to emergency room for evaluation of left knee pain.  Pain is dull in nature, chronic, intermittent and worse with prolonged standing and walking.  Patient states that she has to stand for long times without any break at her current job. She had x-rays done in the emergency room and was told to take Tylenol and Flexeril as needed for pain.  Apart from knee pain, patient does not have significant medical conditions.  Patient has had 1 normal vaginal delivery, denies any surgical history.  Of note, patient admits to 1 pack/day smoking for about 15 years.  Patient stopped smoking for about 2 years around 2007 as she was pregnant.  Patient had Pap test done early this year with [facility] and was normal according to the patient.  We will try to get the records.  Patient was given flu vaccine today in the office.  History reviewed. No pertinent past medical history.  History reviewed. No pertinent surgical history.  History reviewed. No pertinent family history.  Social History   Socioeconomic History   Marital status: Single    Spouse name: Not on file   Number of children: Not on file   Years of education: Not on file   Highest education level: Not on file  Occupational History   Not on file  Tobacco Use   Smoking status: Current Every Day Smoker    Packs/day: 1.00    Types: Cigars   Smokeless tobacco: Never Used  Vaping Use   Vaping Use: Never used  Substance and Sexual Activity    Alcohol use: No   Drug use: No   Sexual activity: Yes    Birth control/protection: Injection  Other Topics Concern   Not on file  Social History Narrative   Not on file   Social Determinants of Health   Financial Resource Strain:    Difficulty of Paying Living Expenses: Not on file  Food Insecurity:    Worried About Charity fundraiser in the Last Year: Not on file   [employer] of Food in the Last Year: Not on file  Transportation Needs:    Lack of Transportation (Medical): Not on file   Lack of Transportation (Non-Medical): Not on file  Physical Activity:    Days of Exercise per Week: Not on file   Minutes of Exercise per Session: Not on file  Stress:    Feeling of Stress : Not on file  Social Connections:    Frequency of Communication with Friends and Family: Not on file   Frequency of Social Gatherings with Friends and Family: Not on file   Attends Religious Services: Not on file   Active Member of Clubs or Organizations: Not on file   Attends Archivist Meetings: Not on file   Marital Status: Not on file  Intimate Partner Violence:    Fear of Current or Ex-Partner: Not on file   Emotionally Abused: Not on file  Physically Abused: Not on file   Sexually Abused: Not on file    ROS Review of Systems  Constitutional: Negative for chills and fever.  HENT: Negative for congestion, sinus pressure, sinus pain and sore throat.   Eyes: Negative for pain and discharge.  Respiratory: Negative for cough and shortness of breath.   Cardiovascular: Negative for chest pain and palpitations.  Gastrointestinal: Negative for abdominal pain, constipation, diarrhea, nausea and vomiting.  Endocrine: Negative for polydipsia and polyuria.  Genitourinary: Negative for dysuria and hematuria.  Musculoskeletal: Positive for arthralgias. Negative for neck pain and neck stiffness.  Skin: Negative for rash.  Neurological: Negative for dizziness and weakness.    Psychiatric/Behavioral: Negative for agitation and behavioral problems.     Objective:   Today's Vitals: BP 120/80 (BP Location: Right Arm, Patient Position: Sitting, Cuff Size: Normal)    Pulse 77    Temp (!) 97.1 F (36.2 C) (Temporal)    Resp 18    Ht _0  (1.626 m)    Wt 175 lb 6.4 oz (79.6 kg)    SpO2 97%    BMI 30.11 kg/m   Physical Exam Vitals reviewed.  Constitutional:      General: She is not in acute distress.    Appearance: She is not diaphoretic.  HENT:     Head: Normocephalic and atraumatic.     Nose: Nose normal.     Mouth/Throat:     Mouth: Mucous membranes are moist.  Eyes:     General: No scleral icterus.    Extraocular Movements: Extraocular movements intact.     Pupils: Pupils are equal, round, and reactive to light.  Cardiovascular:     Rate and Rhythm: Normal rate and regular rhythm.     Pulses: Normal pulses.     Heart sounds: No murmur heard.   Pulmonary:     Breath sounds: Normal breath sounds. No wheezing or rales.  Abdominal:     Palpations: Abdomen is soft.     Tenderness: There is no abdominal tenderness.  Musculoskeletal:        General: Tenderness (Left knee (mild)) present. No swelling.     Cervical back: Neck supple. No tenderness.     Right lower leg: No edema.     Left lower leg: No edema.  Skin:    General: Skin is warm.     Findings: No rash.  Neurological:     General: No focal deficit present.     Mental Status: She is alert and oriented to person, place, and time.  Psychiatric:        Mood and Affect: Mood normal.        Behavior: Behavior normal.     Assessment & Plan:   Problem List Items Addressed This Visit      Musculoskeletal and Integument   Patellofemoral pain syndrome    Had recent ER visit in 10/2019 X-ray knee showed no fracture or dislocation of the left knee. Joint spaces are preserved. No knee joint effusion. History points to pain similar to patellofemoral syndrome Advised knee exercises, material  provided Advised weight loss Voltaren gel and/or Tylenol PRN         Other   Encounter to establish care - Primary    Care established Previous chart reviewed History and medications reviewed with the patient       Relevant Orders   CBC   CMP14+EGFR   HgB A1c   T4 AND TSH   Lipid Profile  Obesity    Advised diet modification and moderate exercise as tolerated      Tobacco use    Smokes 1 pack/day for about 15 years Discussed risks of smoking  Asked about quitting: confirms that currently smokes cigarettes Advise to quit smoking: Educated about QUITTING to reduce the risk of cancer, cardio and cerebrovascular disease. Assess willingness: Unwilling to quit at this time, but is working on cutting back. Assist with counseling and pharmacotherapy: Counseled for 5 minutes and literature provided. Arrange for follow up: Will follow up and continue to offer help.       Other Visit Diagnoses    Need for immunization against influenza       Relevant Orders   Flu Vaccine QUAD 36+ mos IM (Completed)      Outpatient Encounter Medications as of 12/08/2019  Medication Sig   acetaminophen (TYLENOL) 500 MG tablet Take 1,000 mg by mouth every 6 (six) hours as needed for moderate pain.   cyclobenzaprine (FLEXERIL) 10 MG tablet Take 1 tablet (10 mg total) by mouth 3 (three) times daily as needed.   diclofenac (VOLTAREN) 50 MG EC tablet Take 1 tablet (50 mg total) by mouth 2 (two) times daily.   ibuprofen (ADVIL) 800 MG tablet Take 1 tablet (800 mg total) by mouth 3 (three) times daily.   traMADol (ULTRAM) 50 MG tablet 1 or 2 po q6h prn pain   No facility-administered encounter medications on file as of 12/08/2019.    Follow-up: Return in about 6 months (around 06/06/2020), or if symptoms worsen or fail to improve.   Lindell Spar, MD

## 2019-12-08 NOTE — Assessment & Plan Note (Signed)
Advised diet modification and moderate exercise as tolerated

## 2019-12-20 DIAGNOSIS — Z3042 Encounter for surveillance of injectable contraceptive: Secondary | ICD-10-CM | POA: Diagnosis not present

## 2020-06-06 ENCOUNTER — Ambulatory Visit: Payer: Medicaid Other | Admitting: Internal Medicine

## 2021-05-26 DIAGNOSIS — R21 Rash and other nonspecific skin eruption: Secondary | ICD-10-CM | POA: Diagnosis not present

## 2021-05-27 ENCOUNTER — Emergency Department (HOSPITAL_COMMUNITY)
Admission: EM | Admit: 2021-05-27 | Discharge: 2021-05-27 | Disposition: A | Discharge status: Home / Self Care | Payer: Medicaid Other | Attending: Emergency Medicine | Admitting: Emergency Medicine

## 2021-05-27 ENCOUNTER — Encounter (HOSPITAL_COMMUNITY): Payer: Self-pay

## 2021-05-27 ENCOUNTER — Other Ambulatory Visit: Payer: Self-pay

## 2021-05-27 DIAGNOSIS — R21 Rash and other nonspecific skin eruption: Secondary | ICD-10-CM

## 2021-05-27 MED ORDER — PREDNISONE 10 MG (21) PO TBPK: ORAL_TABLET | ORAL | 0 refills | Status: AC

## 2021-05-27 MED ORDER — PREDNISONE 50 MG PO TABS
60.0000 mg | ORAL_TABLET | Freq: Once | ORAL | Status: AC
  Administered 2021-05-27: 60 mg via ORAL
  Filled 2021-05-27: qty 1

## 2021-05-27 NOTE — ED Triage Notes (Addendum)
Pov from home. Cc of rash.  Pt c/o intermittent rash for 2-3 days. Says it is after her mom used Tide to wash her clothes. Used an ointment but did not take anything.  ?

## 2021-05-27 NOTE — ED Provider Notes (Signed)
? ?[facility]  ?Provider Note ? ?CSN: [id number] ?Arrival date & time: 05/26/21 2350 ? ?History ?Chief Complaint  ?Patient presents with  ? Rash  ?  All over but not now  ? ? ?Jennifer Pollard is a 36 y.o. female reports intermittent itchy rash on arms, chest and back for the last few days, comes and goes. Currently minimal. She thinks it might be a new detergent. No difficulty swallowing or breathing,.  ? ? ?Home Medications ?Prior to Admission medications   ?Medication Sig Start Date End Date Taking? Authorizing Provider  ?predniSONE (STERAPRED UNI-PAK 21 TAB) 10 MG (21) TBPK tablet '10mg'$  Tabs, 6 day taper. Use as directed 05/27/21  Yes Truddie Hidden, MD  ?acetaminophen (TYLENOL) 500 MG tablet Take 1,000 mg by mouth every 6 (six) hours as needed for moderate pain.    [provider]  ?cyclobenzaprine (FLEXERIL) 10 MG tablet Take 1 tablet (10 mg total) by mouth 3 (three) times daily as needed. 01/16/18   Triplett, Tammy, PA-C  ?diclofenac (VOLTAREN) 50 MG EC tablet Take 1 tablet (50 mg total) by mouth 2 (two) times daily. 07/25/15   Fransico Meadow, PA-C  ?ibuprofen (ADVIL) 800 MG tablet Take 1 tablet (800 mg total) by mouth 3 (three) times daily. 10/27/19   Garald Balding, PA-C  ?traMADol Veatrice Bourbon) 50 MG tablet 1 or 2 po q6h prn pain 08/29/14   Lily Kocher, PA-C  ? ? ? ?Allergies    ?Patient has no known allergies. ? ? ?Review of Systems   ?Review of Systems ?Please see HPI for pertinent positives and negatives ? ?Physical Exam ?BP (!) 136/96   Pulse (!) 51   Temp 98.2 ?F (36.8 ?C)   Resp 18   Ht '5\' 4"'$  (1.626 m)   Wt 75 kg   SpO2 100%   BMI 28.38 kg/m?  ? ?Physical Exam ?Vitals and nursing note reviewed.  ?HENT:  ?   Head: Normocephalic.  ?   Nose: Nose normal.  ?Eyes:  ?   Extraocular Movements: Extraocular movements intact.  ?   Pupils: Pupils are equal, round, and reactive to light.  ?Pulmonary:  ?   Effort: Pulmonary effort is normal.  ?   Breath sounds: No stridor.   ?Musculoskeletal:     ?   General: Normal range of motion.  ?   Cervical back: Neck supple.  ?Skin: ?   Findings: No rash.  ?Neurological:  ?   Mental Status: She is alert and oriented to person, place, and time.  ?Psychiatric:     ?   Mood and Affect: Mood normal.  ? ? ?ED Results / Procedures / Treatments   ?EKG ?None ? ?Procedures ?Procedures ? ?Medications Ordered in the ED ?Medications  ?predniSONE (DELTASONE) tablet 60 mg (has no administration in time range)  ? ? ?Initial Impression and Plan ? Patient with intermittent rash, sounds like urticaria, will give Rx for prednisone, recommend OTC benadryl for itching. PCP follow up.  ? ?ED Course  ? ?  ? ? ?MDM Rules/Calculators/A&P ?Medical Decision Making ?Problems Addressed: ?Rash: acute illness or injury ? ?Risk ?Prescription drug management. ? ? ? ?Final Clinical Impression(s) / ED Diagnoses ?Final diagnoses:  ?Rash  ? ? ?Rx / DC Orders ?ED Discharge Orders   ? ?      Ordered  ?  predniSONE (STERAPRED UNI-PAK 21 TAB) 10 MG (21) TBPK tablet       ? 05/27/21 0118  ? ?  ?  ? ?  ? ?  ?  Truddie Hidden, MD ?05/27/21 0119 ? ?

## 2021-06-30 ENCOUNTER — Emergency Department (HOSPITAL_COMMUNITY): Payer: Medicaid Other

## 2021-06-30 ENCOUNTER — Inpatient Hospital Stay (HOSPITAL_COMMUNITY)
Admission: EM | Admit: 2021-06-30 | Discharge: 2021-07-15 | DRG: 435 | Disposition: E | Payer: Medicaid Other | Attending: Family Medicine | Admitting: Family Medicine

## 2021-06-30 ENCOUNTER — Encounter (HOSPITAL_COMMUNITY): Payer: Self-pay

## 2021-06-30 ENCOUNTER — Other Ambulatory Visit: Payer: Self-pay

## 2021-06-30 DIAGNOSIS — Z66 Do not resuscitate: Secondary | ICD-10-CM | POA: Diagnosis not present

## 2021-06-30 DIAGNOSIS — N6320 Unspecified lump in the left breast, unspecified quadrant: Secondary | ICD-10-CM | POA: Diagnosis present

## 2021-06-30 DIAGNOSIS — K7460 Unspecified cirrhosis of liver: Secondary | ICD-10-CM | POA: Diagnosis present

## 2021-06-30 DIAGNOSIS — N9489 Other specified conditions associated with female genital organs and menstrual cycle: Secondary | ICD-10-CM

## 2021-06-30 DIAGNOSIS — R188 Other ascites: Secondary | ICD-10-CM

## 2021-06-30 DIAGNOSIS — F17290 Nicotine dependence, other tobacco product, uncomplicated: Secondary | ICD-10-CM | POA: Diagnosis present

## 2021-06-30 DIAGNOSIS — E871 Hypo-osmolality and hyponatremia: Secondary | ICD-10-CM | POA: Diagnosis present

## 2021-06-30 DIAGNOSIS — R768 Other specified abnormal immunological findings in serum: Secondary | ICD-10-CM | POA: Diagnosis present

## 2021-06-30 DIAGNOSIS — E162 Hypoglycemia, unspecified: Secondary | ICD-10-CM | POA: Diagnosis present

## 2021-06-30 DIAGNOSIS — E872 Acidosis, unspecified: Secondary | ICD-10-CM

## 2021-06-30 DIAGNOSIS — Z79899 Other long term (current) drug therapy: Secondary | ICD-10-CM

## 2021-06-30 DIAGNOSIS — D630 Anemia in neoplastic disease: Secondary | ICD-10-CM | POA: Diagnosis present

## 2021-06-30 DIAGNOSIS — K7200 Acute and subacute hepatic failure without coma: Secondary | ICD-10-CM | POA: Diagnosis present

## 2021-06-30 DIAGNOSIS — F17210 Nicotine dependence, cigarettes, uncomplicated: Secondary | ICD-10-CM | POA: Diagnosis present

## 2021-06-30 DIAGNOSIS — Z720 Tobacco use: Secondary | ICD-10-CM | POA: Diagnosis present

## 2021-06-30 DIAGNOSIS — C50912 Malignant neoplasm of unspecified site of left female breast: Secondary | ICD-10-CM | POA: Diagnosis present

## 2021-06-30 DIAGNOSIS — A0472 Enterocolitis due to Clostridium difficile, not specified as recurrent: Secondary | ICD-10-CM

## 2021-06-30 DIAGNOSIS — R140 Abdominal distension (gaseous): Principal | ICD-10-CM

## 2021-06-30 DIAGNOSIS — K766 Portal hypertension: Secondary | ICD-10-CM | POA: Diagnosis present

## 2021-06-30 DIAGNOSIS — E8809 Other disorders of plasma-protein metabolism, not elsewhere classified: Secondary | ICD-10-CM | POA: Diagnosis present

## 2021-06-30 DIAGNOSIS — Z20822 Contact with and (suspected) exposure to covid-19: Secondary | ICD-10-CM | POA: Diagnosis present

## 2021-06-30 DIAGNOSIS — R7401 Elevation of levels of liver transaminase levels: Secondary | ICD-10-CM

## 2021-06-30 DIAGNOSIS — R932 Abnormal findings on diagnostic imaging of liver and biliary tract: Secondary | ICD-10-CM

## 2021-06-30 DIAGNOSIS — K529 Noninfective gastroenteritis and colitis, unspecified: Secondary | ICD-10-CM

## 2021-06-30 DIAGNOSIS — Z8042 Family history of malignant neoplasm of prostate: Secondary | ICD-10-CM

## 2021-06-30 DIAGNOSIS — C787 Secondary malignant neoplasm of liver and intrahepatic bile duct: Principal | ICD-10-CM | POA: Diagnosis present

## 2021-06-30 DIAGNOSIS — D689 Coagulation defect, unspecified: Secondary | ICD-10-CM | POA: Diagnosis present

## 2021-06-30 DIAGNOSIS — A09 Infectious gastroenteritis and colitis, unspecified: Secondary | ICD-10-CM | POA: Diagnosis present

## 2021-06-30 HISTORY — DX: Other specified health status: Z78.9

## 2021-06-30 LAB — COMPREHENSIVE METABOLIC PANEL
ALT: <5 U/L (ref 0–44)
AST: 717 U/L — ABNORMAL HIGH (ref 15–41)
Albumin: 2.2 g/dL — ABNORMAL LOW (ref 3.5–5.0)
Alkaline Phosphatase: 250 U/L — ABNORMAL HIGH (ref 38–126)
Anion gap: 14 (ref 5–15)
BUN: 18 mg/dL (ref 6–20)
CO2: 21 mmol/L — ABNORMAL LOW (ref 22–32)
Calcium: 8.8 mg/dL — ABNORMAL LOW (ref 8.9–10.3)
Chloride: 99 mmol/L (ref 98–111)
Creatinine, Ser: 0.83 mg/dL (ref 0.44–1.00)
GFR, Estimated: >60 mL/min (ref >60)
Glucose, Bld: 73 mg/dL (ref 70–99)
Potassium: 4.5 mmol/L (ref 3.5–5.1)
Sodium: 134 mmol/L — ABNORMAL LOW (ref 135–145)
Total Bilirubin: 6.1 mg/dL — ABNORMAL HIGH (ref 0.3–1.2)
Total Protein: 6.8 g/dL (ref 6.5–8.1)

## 2021-06-30 LAB — URINALYSIS, ROUTINE W REFLEX MICROSCOPIC
Glucose, UA: NEGATIVE mg/dL
Hgb urine dipstick: NEGATIVE
Ketones, ur: 20 mg/dL — AB
Nitrite: NEGATIVE
Protein, ur: 30 mg/dL — AB
Specific Gravity, Urine: 1.023 (ref 1.005–1.030)
pH: 5 (ref 5.0–8.0)

## 2021-06-30 LAB — CBC
HCT: 37.6 % (ref 36.0–46.0)
Hemoglobin: 12.3 g/dL (ref 12.0–15.0)
MCH: 30 pg (ref 26.0–34.0)
MCHC: 32.7 g/dL (ref 30.0–36.0)
MCV: 91.7 fL (ref 80.0–100.0)
Platelets: 157 10*3/uL (ref 150–400)
RBC: 4.1 MIL/uL (ref 3.87–5.11)
RDW: 21.9 % — ABNORMAL HIGH (ref 11.5–15.5)
WBC: 12 10*3/uL — ABNORMAL HIGH (ref 4.0–10.5)
nRBC: 1.5 % — ABNORMAL HIGH (ref 0.0–0.2)

## 2021-06-30 LAB — PROTIME-INR
INR: 1.7 — ABNORMAL HIGH (ref 0.8–1.2)
Prothrombin Time: 19.6 seconds — ABNORMAL HIGH (ref 11.4–15.2)

## 2021-06-30 LAB — ETHANOL: Alcohol, Ethyl (B): <10 mg/dL (ref <10)

## 2021-06-30 LAB — PREGNANCY, URINE: Preg Test, Ur: NEGATIVE

## 2021-06-30 LAB — ACETAMINOPHEN LEVEL: Acetaminophen (Tylenol), Serum: <10 ug/mL — ABNORMAL LOW (ref 10–30)

## 2021-06-30 LAB — LIPASE, BLOOD: Lipase: 63 U/L — ABNORMAL HIGH (ref 11–51)

## 2021-06-30 MED ORDER — IOHEXOL 300 MG/ML  SOLN
75.0000 mL | Freq: Once | INTRAMUSCULAR | Status: AC | PRN
  Administered 2021-06-30: 75 mL via INTRAVENOUS

## 2021-06-30 NOTE — ED Provider Notes (Signed)
?[facility] ?Provider Note ? ? ?CSN: [id number] ?Arrival date & time: 06/21/2021  2011 ? ?  ? ?History ?Chief Complaint  ?Patient presents with  ? Abdominal Pain  ? ? ?Jennifer Pollard is a 36 y.o. female who presents to the emergency department with abdominal swelling that started a week and a half ago.  Patient endorses associated nausea and vomiting.  She denies any diarrhea.  Patient endorses some constipation as well.  Patient has no new sexual partners and wears condoms every time with her current boyfriend.  She denies any IV drug abuse, alcohol abuse. She does mention she used to drink 1 drink daily but has not done so in several months. She does endorse marijuana use.  No fever or chills. No urinary complaints. ? ? ?Abdominal Pain ? ?  ? ?Home Medications ?Prior to Admission medications   ?Medication Sig Start Date End Date Taking? Authorizing Provider  ?acetaminophen (TYLENOL) 500 MG tablet Take 1,000 mg by mouth every 6 (six) hours as needed for moderate pain.    [provider]  ?cyclobenzaprine (FLEXERIL) 10 MG tablet Take 1 tablet (10 mg total) by mouth 3 (three) times daily as needed. 01/16/18   Triplett, Tammy, PA-C  ?diclofenac (VOLTAREN) 50 MG EC tablet Take 1 tablet (50 mg total) by mouth 2 (two) times daily. 07/25/15   Fransico Meadow, PA-C  ?ibuprofen (ADVIL) 800 MG tablet Take 1 tablet (800 mg total) by mouth 3 (three) times daily. 10/27/19   Garald Balding, PA-C  ?predniSONE (STERAPRED UNI-PAK 21 TAB) 10 MG (21) TBPK tablet '10mg'$  Tabs, 6 day taper. Use as directed 05/27/21   Truddie Hidden, MD  ?traMADol Veatrice Bourbon) 50 MG tablet 1 or 2 po q6h prn pain 08/29/14   Lily Kocher, PA-C  ?   ? ?Allergies    ?Patient has no known allergies.   ? ?Review of Systems   ?Review of Systems  ?Gastrointestinal:  Positive for abdominal pain.  ?All other systems reviewed and are negative. ? ?Physical Exam ?Updated Vital Signs ?BP 108/76   Pulse (!) 108   Temp 98.1 ?F (36.7 ?C) (Oral)    Resp 16   Ht '5\' 4"'$  (1.626 m)   Wt 68 kg   LMP 06/10/2021   SpO2 94%   BMI 25.75 kg/m?  ?Physical Exam ?Vitals and nursing note reviewed.  ?Constitutional:   ?   General: She is not in acute distress. ?   Appearance: Normal appearance.  ?HENT:  ?   Head: Normocephalic and atraumatic.  ?Eyes:  ?   General: Scleral icterus present.     ?   Right eye: No discharge.     ?   Left eye: No discharge.  ?Cardiovascular:  ?   Rate and Rhythm: Tachycardia present.  ?   Comments:  S1/S2 are distinct without any evidence of murmur, rubs, or gallops.  Radial pulses are 2+ bilaterally.  Dorsalis pedis pulses are 2+ bilaterally.  No evidence of pedal edema. ?Pulmonary:  ?   Comments: Clear to auscultation bilaterally.  Normal effort.  No respiratory distress.  No evidence of wheezes, rales, or rhonchi heard throughout. ?Abdominal:  ?   General: Abdomen is flat. Bowel sounds are normal. There is distension.  ?   Palpations: There is fluid wave and hepatomegaly.  ?   Tenderness: There is no abdominal tenderness. There is no guarding or rebound.  ?Musculoskeletal:     ?   General: Normal range of motion.  ?  Cervical back: Neck supple.  ?Skin: ?   General: Skin is warm and dry.  ?   Findings: No rash.  ?Neurological:  ?   General: No focal deficit present.  ?   Mental Status: She is alert.  ?Psychiatric:     ?   Mood and Affect: Mood normal.     ?   Behavior: Behavior normal.  ? ? ?ED Results / Procedures / Treatments   ?Labs ?(all labs ordered are listed, but only abnormal results are displayed) ?Labs Reviewed  ?LIPASE, BLOOD - Abnormal; Notable for the following components:  ?    Result Value  ? Lipase 63 (*)   ? All other components within normal limits  ?COMPREHENSIVE METABOLIC PANEL - Abnormal; Notable for the following components:  ? Sodium 134 (*)   ? CO2 21 (*)   ? Calcium 8.8 (*)   ? Albumin 2.2 (*)   ? AST 717 (*)   ? Alkaline Phosphatase 250 (*)   ? Total Bilirubin 6.1 (*)   ? All other components within normal  limits  ?CBC - Abnormal; Notable for the following components:  ? WBC 12.0 (*)   ? RDW 21.9 (*)   ? nRBC 1.5 (*)   ? All other components within normal limits  ?URINALYSIS, ROUTINE W REFLEX MICROSCOPIC - Abnormal; Notable for the following components:  ? Color, Urine AMBER (*)   ? APPearance HAZY (*)   ? Bilirubin Urine MODERATE (*)   ? Ketones, ur 20 (*)   ? Protein, ur 30 (*)   ? Leukocytes,Ua TRACE (*)   ? Bacteria, UA RARE (*)   ? All other components within normal limits  ?PROTIME-INR - Abnormal; Notable for the following components:  ? Prothrombin Time 19.6 (*)   ? INR 1.7 (*)   ? All other components within normal limits  ?ACETAMINOPHEN LEVEL - Abnormal; Notable for the following components:  ? Acetaminophen (Tylenol), Serum <10 (*)   ? All other components within normal limits  ?RESP PANEL BY RT-PCR (FLU A&B, COVID) ARPGX2  ?PREGNANCY, URINE  ?ETHANOL  ? ? ?EKG ?None ? ?Radiology ?CT ABDOMEN PELVIS W CONTRAST ? ?Result Date: 07/12/2021 ?CLINICAL DATA:  Abdominal pain, acute, nonlocalized abdominal distension EXAM: CT ABDOMEN AND PELVIS WITH CONTRAST TECHNIQUE: Multidetector CT imaging of the abdomen and pelvis was performed using the standard protocol following bolus administration of intravenous contrast. RADIATION DOSE REDUCTION: This exam was performed according to the departmental dose-optimization program which includes automated exposure control, adjustment of the mA and/or kV according to patient size and/or use of iterative reconstruction technique. CONTRAST:  2m OMNIPAQUE IOHEXOL 300 MG/ML  SOLN COMPARISON:  None. FINDINGS: Lower chest: No acute abnormality. Hepatobiliary: The liver is enlarged measuring up to 22.5 cm. Query nodular hepatic contour. Innumerable heterogeneous hypodense hepatic lesions with as an example a 3.1 cm lesion (2:36). Contracted gallbladder. No gallstones. No biliary dilatation. Pancreas: No focal lesion. Normal pancreatic contour. No surrounding inflammatory changes. No  main pancreatic ductal dilatation. Spleen: Normal in size without focal abnormality. Adrenals/Urinary Tract: No adrenal nodule bilaterally. Bilateral kidneys enhance symmetrically. No hydronephrosis. No hydroureter. The urinary bladder is decompressed. Stomach/Bowel: Stomach is within normal limits. No evidence of small bowel wall thickening or dilatation. The ascending and transverse colon demonstrate circumferential diffuse bowel wall thickening and submucosal hyperemia. Appendix appears normal. Vascular/Lymphatic: Recanalized paraumbilical vein. Venous collaterals noted within the right abdomen. Perigastric venous collaterals. No abdominal aorta or iliac aneurysm. No abdominal, pelvic, or inguinal lymphadenopathy.  Reproductive: Query soft tissue density 2.5 x 2.2 cm right adnexal lesion (2:67). The left adnexal region is unremarkable. The uterus is otherwise unremarkable. Other: No intraperitoneal free fluid. No intraperitoneal free gas. No organized fluid collection. Musculoskeletal: No abdominal wall hernia or abnormality. No suspicious lytic or blastic osseous lesions. No acute displaced fracture. IMPRESSION: 1. 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 Innumerable hepatic lesions concerning for metastatic malignancy. Hepatomegaly. Recommend MRI liver protocol for further evaluation. When the patient is clinically stable and able to follow directions and hold their breath (preferably as an outpatient) further evaluation with dedicated abdominal MRI should be considered. 2. Moderate volume simple fluid ascites. 3. Query soft tissue density 2.5 x 2.2 cm right adnexal lesion. Recommend pelvic ultrasound for further evaluation. 4. Findings suggestive of ascending and transverse colitis. Electronically Signed   By: Iven Finn M.D.   On: 06/25/2021 22:36   ? ?Procedures ?Ultrasound ED Abd ? ?Date/Time: 06/26/2021 11:36 PM ?Performed by: Hendricks Limes,  PA-C ?Authorized by: Hendricks Limes, PA-C  ? ?Procedure details:  ?  Indications comment:  Abdominal distension ?  Assessment for:  Intra-abdominal fluid ?  Hepatobiliary:  Visualized ?  Bladder:  Visualized  ?  Images

## 2021-06-30 NOTE — ED Notes (Addendum)
Sclera yellow in color.  ?Pt denies any hx of liver issues. Abd swelling noted , pt states since April 9th.  ?

## 2021-06-30 NOTE — ED Triage Notes (Signed)
Pov from home. Cc of abdominal pain and swelling since the 9th. Concerns for pregnancy.  ?Period is due 4/27 ?

## 2021-06-30 NOTE — ED Notes (Signed)
Pt returned from CT °

## 2021-07-01 ENCOUNTER — Inpatient Hospital Stay (HOSPITAL_COMMUNITY): Payer: Medicaid Other

## 2021-07-01 ENCOUNTER — Encounter (HOSPITAL_COMMUNITY): Payer: Self-pay | Admitting: Family Medicine

## 2021-07-01 DIAGNOSIS — K7200 Acute and subacute hepatic failure without coma: Secondary | ICD-10-CM | POA: Diagnosis not present

## 2021-07-01 DIAGNOSIS — C50912 Malignant neoplasm of unspecified site of left female breast: Secondary | ICD-10-CM | POA: Diagnosis present

## 2021-07-01 DIAGNOSIS — R140 Abdominal distension (gaseous): Principal | ICD-10-CM

## 2021-07-01 DIAGNOSIS — K6389 Other specified diseases of intestine: Secondary | ICD-10-CM | POA: Diagnosis not present

## 2021-07-01 DIAGNOSIS — N6320 Unspecified lump in the left breast, unspecified quadrant: Secondary | ICD-10-CM | POA: Diagnosis not present

## 2021-07-01 DIAGNOSIS — C7989 Secondary malignant neoplasm of other specified sites: Secondary | ICD-10-CM | POA: Diagnosis not present

## 2021-07-01 DIAGNOSIS — D630 Anemia in neoplastic disease: Secondary | ICD-10-CM | POA: Diagnosis present

## 2021-07-01 DIAGNOSIS — N644 Mastodynia: Secondary | ICD-10-CM | POA: Diagnosis not present

## 2021-07-01 DIAGNOSIS — J9811 Atelectasis: Secondary | ICD-10-CM | POA: Diagnosis not present

## 2021-07-01 DIAGNOSIS — N6325 Unspecified lump in the left breast, overlapping quadrants: Secondary | ICD-10-CM | POA: Diagnosis not present

## 2021-07-01 DIAGNOSIS — K529 Noninfective gastroenteritis and colitis, unspecified: Secondary | ICD-10-CM | POA: Diagnosis not present

## 2021-07-01 DIAGNOSIS — N9489 Other specified conditions associated with female genital organs and menstrual cycle: Secondary | ICD-10-CM | POA: Diagnosis not present

## 2021-07-01 DIAGNOSIS — R768 Other specified abnormal immunological findings in serum: Secondary | ICD-10-CM | POA: Diagnosis present

## 2021-07-01 DIAGNOSIS — F17210 Nicotine dependence, cigarettes, uncomplicated: Secondary | ICD-10-CM | POA: Diagnosis present

## 2021-07-01 DIAGNOSIS — E8809 Other disorders of plasma-protein metabolism, not elsewhere classified: Secondary | ICD-10-CM

## 2021-07-01 DIAGNOSIS — C50919 Malignant neoplasm of unspecified site of unspecified female breast: Secondary | ICD-10-CM | POA: Diagnosis not present

## 2021-07-01 DIAGNOSIS — C50412 Malignant neoplasm of upper-outer quadrant of left female breast: Secondary | ICD-10-CM | POA: Diagnosis not present

## 2021-07-01 DIAGNOSIS — E872 Acidosis, unspecified: Secondary | ICD-10-CM

## 2021-07-01 DIAGNOSIS — R160 Hepatomegaly, not elsewhere classified: Secondary | ICD-10-CM | POA: Diagnosis not present

## 2021-07-01 DIAGNOSIS — A09 Infectious gastroenteritis and colitis, unspecified: Secondary | ICD-10-CM | POA: Diagnosis present

## 2021-07-01 DIAGNOSIS — C787 Secondary malignant neoplasm of liver and intrahepatic bile duct: Secondary | ICD-10-CM | POA: Diagnosis present

## 2021-07-01 DIAGNOSIS — A0472 Enterocolitis due to Clostridium difficile, not specified as recurrent: Secondary | ICD-10-CM | POA: Diagnosis not present

## 2021-07-01 DIAGNOSIS — Z7189 Other specified counseling: Secondary | ICD-10-CM | POA: Diagnosis not present

## 2021-07-01 DIAGNOSIS — D689 Coagulation defect, unspecified: Secondary | ICD-10-CM | POA: Diagnosis present

## 2021-07-01 DIAGNOSIS — R7401 Elevation of levels of liver transaminase levels: Secondary | ICD-10-CM | POA: Diagnosis not present

## 2021-07-01 DIAGNOSIS — R7989 Other specified abnormal findings of blood chemistry: Secondary | ICD-10-CM | POA: Diagnosis not present

## 2021-07-01 DIAGNOSIS — Z515 Encounter for palliative care: Secondary | ICD-10-CM | POA: Diagnosis not present

## 2021-07-01 DIAGNOSIS — R1900 Intra-abdominal and pelvic swelling, mass and lump, unspecified site: Secondary | ICD-10-CM | POA: Diagnosis not present

## 2021-07-01 DIAGNOSIS — K449 Diaphragmatic hernia without obstruction or gangrene: Secondary | ICD-10-CM | POA: Diagnosis not present

## 2021-07-01 DIAGNOSIS — E162 Hypoglycemia, unspecified: Secondary | ICD-10-CM | POA: Diagnosis present

## 2021-07-01 DIAGNOSIS — K828 Other specified diseases of gallbladder: Secondary | ICD-10-CM | POA: Diagnosis not present

## 2021-07-01 DIAGNOSIS — Z66 Do not resuscitate: Secondary | ICD-10-CM | POA: Diagnosis not present

## 2021-07-01 DIAGNOSIS — Z79899 Other long term (current) drug therapy: Secondary | ICD-10-CM | POA: Diagnosis not present

## 2021-07-01 DIAGNOSIS — R599 Enlarged lymph nodes, unspecified: Secondary | ICD-10-CM | POA: Diagnosis not present

## 2021-07-01 DIAGNOSIS — K7460 Unspecified cirrhosis of liver: Secondary | ICD-10-CM | POA: Diagnosis not present

## 2021-07-01 DIAGNOSIS — E871 Hypo-osmolality and hyponatremia: Secondary | ICD-10-CM | POA: Diagnosis present

## 2021-07-01 DIAGNOSIS — K766 Portal hypertension: Secondary | ICD-10-CM | POA: Diagnosis present

## 2021-07-01 DIAGNOSIS — R932 Abnormal findings on diagnostic imaging of liver and biliary tract: Secondary | ICD-10-CM | POA: Diagnosis not present

## 2021-07-01 DIAGNOSIS — F17290 Nicotine dependence, other tobacco product, uncomplicated: Secondary | ICD-10-CM | POA: Diagnosis present

## 2021-07-01 DIAGNOSIS — R188 Other ascites: Secondary | ICD-10-CM | POA: Diagnosis not present

## 2021-07-01 DIAGNOSIS — Z20822 Contact with and (suspected) exposure to covid-19: Secondary | ICD-10-CM | POA: Diagnosis present

## 2021-07-01 LAB — CBC WITH DIFFERENTIAL/PLATELET
Abs Immature Granulocytes: 0.07 10*3/uL (ref 0.00–0.07)
Basophils Absolute: 0 10*3/uL (ref 0.0–0.1)
Basophils Relative: 0 %
Eosinophils Absolute: 0 10*3/uL (ref 0.0–0.5)
Eosinophils Relative: 0 %
HCT: 37.9 % (ref 36.0–46.0)
Hemoglobin: 12.3 g/dL (ref 12.0–15.0)
Immature Granulocytes: 1 %
Lymphocytes Relative: 13 %
Lymphs Abs: 1.5 10*3/uL (ref 0.7–4.0)
MCH: 29.8 pg (ref 26.0–34.0)
MCHC: 32.5 g/dL (ref 30.0–36.0)
MCV: 91.8 fL (ref 80.0–100.0)
Monocytes Absolute: 1.8 10*3/uL — ABNORMAL HIGH (ref 0.1–1.0)
Monocytes Relative: 15 %
Neutro Abs: 8.7 10*3/uL — ABNORMAL HIGH (ref 1.7–7.7)
Neutrophils Relative %: 71 %
Platelets: 158 10*3/uL (ref 150–400)
RBC: 4.13 MIL/uL (ref 3.87–5.11)
RDW: 21.6 % — ABNORMAL HIGH (ref 11.5–15.5)
WBC: 12.1 10*3/uL — ABNORMAL HIGH (ref 4.0–10.5)
nRBC: 1.4 % — ABNORMAL HIGH (ref 0.0–0.2)

## 2021-07-01 LAB — BODY FLUID CELL COUNT WITH DIFFERENTIAL
Eos, Fluid: 0 %
Lymphs, Fluid: 18 %
Monocyte-Macrophage-Serous Fluid: 65 % (ref 50–90)
Neutrophil Count, Fluid: 17 % (ref 0–25)
Total Nucleated Cell Count, Fluid: 156 cu mm (ref 0–1000)

## 2021-07-01 LAB — URINALYSIS, ROUTINE W REFLEX MICROSCOPIC
Bilirubin Urine: NEGATIVE
Glucose, UA: NEGATIVE mg/dL
Hgb urine dipstick: NEGATIVE
Ketones, ur: NEGATIVE mg/dL
Leukocytes,Ua: NEGATIVE
Nitrite: NEGATIVE
Protein, ur: NEGATIVE mg/dL
Specific Gravity, Urine: 1.011 (ref 1.005–1.030)
pH: 5 (ref 5.0–8.0)

## 2021-07-01 LAB — IRON AND TIBC
Iron: 149 ug/dL (ref 28–170)
Saturation Ratios: 96 % — ABNORMAL HIGH (ref 10.4–31.8)
TIBC: 155 ug/dL — ABNORMAL LOW (ref 250–450)
UIBC: 6 ug/dL

## 2021-07-01 LAB — COMPREHENSIVE METABOLIC PANEL
ALT: 130 U/L — ABNORMAL HIGH (ref 0–44)
AST: 675 U/L — ABNORMAL HIGH (ref 15–41)
Albumin: 2.1 g/dL — ABNORMAL LOW (ref 3.5–5.0)
Alkaline Phosphatase: 238 U/L — ABNORMAL HIGH (ref 38–126)
Anion gap: 14 (ref 5–15)
BUN: 18 mg/dL (ref 6–20)
CO2: 21 mmol/L — ABNORMAL LOW (ref 22–32)
Calcium: 8.5 mg/dL — ABNORMAL LOW (ref 8.9–10.3)
Chloride: 98 mmol/L (ref 98–111)
Creatinine, Ser: 0.85 mg/dL (ref 0.44–1.00)
GFR, Estimated: >60 mL/min (ref >60)
Glucose, Bld: 95 mg/dL (ref 70–99)
Potassium: 3.8 mmol/L (ref 3.5–5.1)
Sodium: 133 mmol/L — ABNORMAL LOW (ref 135–145)
Total Bilirubin: 5.9 mg/dL — ABNORMAL HIGH (ref 0.3–1.2)
Total Protein: 6.7 g/dL (ref 6.5–8.1)

## 2021-07-01 LAB — GRAM STAIN: Gram Stain: NONE SEEN

## 2021-07-01 LAB — RESP PANEL BY RT-PCR (FLU A&B, COVID) ARPGX2
Influenza A by PCR: NEGATIVE
Influenza B by PCR: NEGATIVE
SARS Coronavirus 2 by RT PCR: NEGATIVE

## 2021-07-01 LAB — PROTIME-INR
INR: 1.7 — ABNORMAL HIGH (ref 0.8–1.2)
Prothrombin Time: 20.1 seconds — ABNORMAL HIGH (ref 11.4–15.2)

## 2021-07-01 LAB — HEPATITIS PANEL, ACUTE
HCV Ab: NONREACTIVE
Hep A IgM: NONREACTIVE
Hep B C IgM: NONREACTIVE
Hepatitis B Surface Ag: NONREACTIVE

## 2021-07-01 LAB — ALBUMIN, PLEURAL OR PERITONEAL FLUID: Albumin, Fluid: <1.5 g/dL

## 2021-07-01 LAB — GLUCOSE, PLEURAL OR PERITONEAL FLUID: Glucose, Fluid: 91 mg/dL

## 2021-07-01 LAB — LACTATE DEHYDROGENASE, PLEURAL OR PERITONEAL FLUID: LD, Fluid: 204 U/L — ABNORMAL HIGH (ref 3–23)

## 2021-07-01 LAB — LACTIC ACID, PLASMA
Lactic Acid, Venous: 7.9 mmol/L (ref 0.5–1.9)
Lactic Acid, Venous: 8.1 mmol/L (ref 0.5–1.9)

## 2021-07-01 LAB — HIV ANTIBODY (ROUTINE TESTING W REFLEX): HIV Screen 4th Generation wRfx: NONREACTIVE

## 2021-07-01 LAB — PROTEIN, PLEURAL OR PERITONEAL FLUID: Total protein, fluid: <3 g/dL

## 2021-07-01 LAB — TSH: TSH: 1.74 u[IU]/mL (ref 0.350–4.500)

## 2021-07-01 LAB — AMMONIA: Ammonia: 47 umol/L — ABNORMAL HIGH (ref 9–35)

## 2021-07-01 LAB — FERRITIN: Ferritin: >7500 ng/mL — ABNORMAL HIGH (ref 11–307)

## 2021-07-01 LAB — MAGNESIUM: Magnesium: 2.3 mg/dL (ref 1.7–2.4)

## 2021-07-01 LAB — MRSA NEXT GEN BY PCR, NASAL: MRSA by PCR Next Gen: NOT DETECTED

## 2021-07-01 MED ORDER — OXYCODONE HCL 5 MG PO TABS
5.0000 mg | ORAL_TABLET | ORAL | Status: DC | PRN
  Administered 2021-07-02 – 2021-07-06 (×2): 5 mg via ORAL
  Filled 2021-07-01 (×2): qty 1

## 2021-07-01 MED ORDER — CIPROFLOXACIN IN D5W 400 MG/200ML IV SOLN
400.0000 mg | Freq: Two times a day (BID) | INTRAVENOUS | Status: DC
  Administered 2021-07-01 – 2021-07-03 (×6): 400 mg via INTRAVENOUS
  Filled 2021-07-01 (×6): qty 200

## 2021-07-01 MED ORDER — METRONIDAZOLE 500 MG/100ML IV SOLN
500.0000 mg | Freq: Two times a day (BID) | INTRAVENOUS | Status: DC
  Administered 2021-07-01 – 2021-07-03 (×6): 500 mg via INTRAVENOUS
  Filled 2021-07-01 (×6): qty 100

## 2021-07-01 MED ORDER — PROCHLORPERAZINE EDISYLATE 10 MG/2ML IJ SOLN
10.0000 mg | INTRAMUSCULAR | Status: DC | PRN
  Administered 2021-07-01: 10 mg via INTRAVENOUS
  Filled 2021-07-01: qty 2

## 2021-07-01 MED ORDER — ONDANSETRON HCL 4 MG PO TABS
4.0000 mg | ORAL_TABLET | Freq: Four times a day (QID) | ORAL | Status: DC | PRN
  Administered 2021-07-05: 4 mg via ORAL
  Filled 2021-07-01: qty 1

## 2021-07-01 MED ORDER — SODIUM CHLORIDE 0.9 % IV SOLN: INTRAVENOUS | Status: DC

## 2021-07-01 MED ORDER — CHLORHEXIDINE GLUCONATE CLOTH 2 % EX PADS
6.0000 | MEDICATED_PAD | Freq: Every day | CUTANEOUS | Status: DC
  Administered 2021-07-01 – 2021-07-05 (×4): 6 via TOPICAL

## 2021-07-01 MED ORDER — GADOBUTROL 1 MMOL/ML IV SOLN
7.0000 mL | Freq: Once | INTRAVENOUS | Status: AC | PRN
  Administered 2021-07-01: 7 mL via INTRAVENOUS

## 2021-07-01 MED ORDER — IBUPROFEN 400 MG PO TABS: 400.0000 mg | ORAL_TABLET | Freq: Four times a day (QID) | ORAL | Status: DC | PRN

## 2021-07-01 MED ORDER — LORAZEPAM 0.5 MG PO TABS
0.5000 mg | ORAL_TABLET | ORAL | Status: DC | PRN
  Administered 2021-07-01 – 2021-07-05 (×8): 0.5 mg via ORAL
  Filled 2021-07-01 (×8): qty 1

## 2021-07-01 MED ORDER — HEPARIN SODIUM (PORCINE) 5000 UNIT/ML IJ SOLN
5000.0000 [IU] | Freq: Three times a day (TID) | INTRAMUSCULAR | Status: DC
  Administered 2021-07-01 – 2021-07-02 (×3): 5000 [IU] via SUBCUTANEOUS
  Filled 2021-07-01 (×4): qty 1

## 2021-07-01 MED ORDER — PANTOPRAZOLE SODIUM 40 MG IV SOLR
40.0000 mg | Freq: Two times a day (BID) | INTRAVENOUS | Status: DC
  Administered 2021-07-01 – 2021-07-05 (×9): 40 mg via INTRAVENOUS
  Filled 2021-07-01 (×10): qty 10

## 2021-07-01 MED ORDER — ALUM & MAG HYDROXIDE-SIMETH 200-200-20 MG/5ML PO SUSP
30.0000 mL | ORAL | Status: DC | PRN
  Filled 2021-07-01: qty 30

## 2021-07-01 MED ORDER — ONDANSETRON HCL 4 MG/2ML IJ SOLN
4.0000 mg | Freq: Four times a day (QID) | INTRAMUSCULAR | Status: DC | PRN
  Administered 2021-07-01 – 2021-07-04 (×3): 4 mg via INTRAVENOUS
  Filled 2021-07-01 (×5): qty 2

## 2021-07-01 MED ORDER — SPIRONOLACTONE 25 MG PO TABS
25.0000 mg | ORAL_TABLET | Freq: Every day | ORAL | Status: DC
  Administered 2021-07-01 – 2021-07-05 (×5): 25 mg via ORAL
  Filled 2021-07-01 (×5): qty 1

## 2021-07-01 MED ORDER — ALUM & MAG HYDROXIDE-SIMETH 200-200-20 MG/5ML PO SUSP
15.0000 mL | ORAL | Status: DC | PRN
  Administered 2021-07-01: 15 mL via ORAL
  Filled 2021-07-01: qty 30

## 2021-07-01 MED ORDER — NICOTINE 21 MG/24HR TD PT24
21.0000 mg | MEDICATED_PATCH | Freq: Every day | TRANSDERMAL | Status: DC
  Administered 2021-07-01 – 2021-07-05 (×5): 21 mg via TRANSDERMAL
  Filled 2021-07-01 (×5): qty 1

## 2021-07-01 MED ORDER — FUROSEMIDE 10 MG/ML IJ SOLN
40.0000 mg | Freq: Once | INTRAMUSCULAR | Status: AC
  Administered 2021-07-01: 40 mg via INTRAVENOUS
  Filled 2021-07-01: qty 4

## 2021-07-01 NOTE — Progress Notes (Signed)
Date and time results received: 07/01/21 0503 ?(use smartphrase ".now" to insert current time) ? ?Test: Lactic Acid ?Critical Value: 8.2 ? ?Name of Provider Notified: A. Zierle-Ghosh ? ?Orders Received? Or Actions Taken?: Actions Taken: Awaiting Orders ?

## 2021-07-01 NOTE — H&P (Signed)
?History and Physical  ? ? ?Patient: Jennifer Pollard YIR:[id number] DOB: [birth year] ?DOA: 06/15/2021 ?DOS: the patient was seen and examined on 07/01/2021 ?PCP: Lindell Spar, MD  ?Patient coming from: Home ? ?Chief Complaint:  ?Chief Complaint  ?Patient presents with  ? Abdominal Pain  ? ?HPI: Jennifer Pollard is a 36 y.o. female with medical history significant of with history of tobacco use disorder, and marijuana use, presents to the ED with a chief complaint of abdominal swelling.  Patient reports that she had sudden onset of abdominal swelling in April 10.  She thinks she had about 10 pound weight gain.  She does have pain that is in the left lower quadrant that feels crampy and is intermittent.  Patient reports that today she started having diarrhea and had at least 5 episodes.  She denies hematochezia or melena.  She has no known liver problems.  She has no family history of liver disease.  Patient denies any fevers.  She does report a decrease in her urine output that started a few days ago.  She has had no dysuria or hematuria.  Patient denies peripheral edema.  Patient reports that she drinks about 3 times per year.  She has not had right upper quadrant pain.  She denies any high risk sexual behavior, reporting that she has 1 partner that she has been with for some time.  She does not use IV drugs.  Patient has no other complaints at this time. ? ?Patient smokes cigarettes about a pack per day.  She does request a nicotine patch.  She is ready to quit.  She reports she drinks alcohol about 3 times per year.  She uses marijuana and the last use was 5 PM on the day of presentation.  She is vaccinated for COVID.  Patient is full code. ?Review of Systems: As mentioned in the history of present illness. All other systems reviewed and are negative. ?No past medical history on file. ?No past surgical history on file. ?Social History:  reports that she has been smoking cigars. She has been smoking an average of 1  pack per day. She has never used smokeless tobacco. She reports that she does not drink alcohol and does not use drugs. ? ?No Known Allergies ? ?No family history on file. ? ?Prior to Admission medications   ?Medication Sig Start Date End Date Taking? Authorizing Provider  ?acetaminophen (TYLENOL) 500 MG tablet Take 1,000 mg by mouth every 6 (six) hours as needed for moderate pain.    [provider]  ?cyclobenzaprine (FLEXERIL) 10 MG tablet Take 1 tablet (10 mg total) by mouth 3 (three) times daily as needed. 01/16/18   Triplett, Tammy, PA-C  ?diclofenac (VOLTAREN) 50 MG EC tablet Take 1 tablet (50 mg total) by mouth 2 (two) times daily. 07/25/15   Fransico Meadow, PA-C  ?ibuprofen (ADVIL) 800 MG tablet Take 1 tablet (800 mg total) by mouth 3 (three) times daily. 10/27/19   Garald Balding, PA-C  ?predniSONE (STERAPRED UNI-PAK 21 TAB) 10 MG (21) TBPK tablet 65m Tabs, 6 day taper. Use as directed 05/27/21   STruddie Hidden MD  ?traMADol (Veatrice Bourbon 50 MG tablet 1 or 2 po q6h prn pain 08/29/14   BLily Kocher PA-C  ? ? ?Physical Exam: ?Vitals:  ? 07/02/2021 2126 06/29/2021 2227 07/05/2021 2234 07/01/2021 2235  ?BP:  113/78 108/76   ?Pulse:  (!) 109  (!) 108  ?Resp:  16    ?Temp: 98.1 ?F (  36.7 ?C)     ?TempSrc: Oral     ?SpO2:  94%  94%  ?Weight:      ?Height:      ? ?1.  General: ?Patient lying supine in bed,  no acute distress ?  ?2. Psychiatric: ?Alert and oriented x 3, mood and behavior normal for situation, pleasant and cooperative with exam ?  ?3. Neurologic: ?Speech and language are normal, face is symmetric, moves all 4 extremities voluntarily, at baseline without acute deficits on limited exam ?  ?4. HEENMT:  ?Head is atraumatic, normocephalic, pupils reactive to light, neck is supple, trachea is midline, mucous membranes are moist ?  ?5. Respiratory : ?Lungs are clear to auscultation bilaterally without wheezing, rhonchi, rales, no cyanosis, no increase in work of breathing or accessory muscle use ?  ?6.  Cardiovascular : ?Heart rate normal, rhythm is regular, no murmurs, rubs or gallops, no peripheral edema, peripheral pulses palpated ?  ?7. Gastrointestinal:  ?Abdomen is slightly distended with fluid wave, soft, nontender to palpation, bowel sounds active ?  ?8. Skin:  ?Skin is warm, dry and intact without rashes, acute lesions, or ulcers on limited exam ?  ?9.Musculoskeletal:  ?No acute deformities or trauma, no asymmetry in tone, no peripheral edema, peripheral pulses palpated, no tenderness to palpation in the extremities ? ? ?Data Reviewed: ?In the ED ?Temp 98.1, heart rate 108-112, respiratory rate 16-17, blood pressure 108/76, satting at 94% on room air ?No leukocytosis with a white blood cell count of 12.0, hemoglobin 12.3 ?Chemistry panel is unremarkable ?Liver profile shows alk phos 250, albumin 2.2, AST 63, ALT 717, T. bili 6.1, INR 1.7 ?CT of the abdomen was done that showed cirrhotic changes of the liver with possible metastatic malignancy. ?Admission was requested for acute liver failure ? ? ?Assessment and Plan: ?* Acute liver failure ?- AST 63, ALT 717 ?-Alk phos 250 ?- Albumin 2.2 ?- Total bili 6.1 ?- Scleral icterus and ascites on exam ?- Tylenol level less than 10 ?- Alcohol level less than 10 ?- CT scan shows cirrhosis with venous collaterals suggesting portal hypertension.  Innumerable hepatic lesions concerning for metastatic malignancy.  Recommending outpatient MRI with liver protocol.  Moderate volume simple fluid ascites. ?-Check AFP, CEA, CA 19-9 ?-Lasix for ascites, urinary response is not adequate, consider paracentesis ?-No family history of liver disease, iron level normal, low suspicion for hemochromatosis ?-ceruloplasmin pending ?-Start spironolactone for portal hypertension ?-Acute hepatitis panel pending ?Continue to monitor ? ? ?Lactic acidosis ?- Blood pressure stable 108/76 ?- Secondary to inhibited metabolism in the liver secondary to acute liver failure ?-Gentle hydration in  the setting of hypoalbuminemia, ascites, ?-Trend with morning labs ? ?Adnexal mass ?- CT scan shows 2.5 x 2.2 cm right adnexal lesion.  Recommending pelvic ultrasound ?-Pregnancy test negative ? ?Colitis ?- Reports 5-6 episodes of diarrhea today ?-CT scan shows ascending and transverse colitis ?-Leukocytosis 12.0 ?-Start Cipro Flagyl ?-GI pathogen's panel should patient continue to have diarrhea ? ?Hypoalbuminemia ?- Albumin 2.2 ?- Secondary to acute liver failure ?- Discussed risks and benefits of albumin infusion, determined to wait and see results of Lasix alone ?-Trend in the a.m. ? ?Tobacco use ?- Counseled on importance of cessation ?- Patient interested in quitting ?- Start nicotine patch ? ? ? ? ? Advance Care Planning:   Code Status: Not on file full ? ?Consults: None at this time, may need gastro or oncology later ? ?Family Communication: No family at bedside ? ?Severity of Illness: ?The  appropriate patient status for this patient is INPATIENT. Inpatient status is judged to be reasonable and necessary in order to provide the required intensity of service to ensure the patient's safety. The patient's presenting symptoms, physical exam findings, and initial radiographic and laboratory data in the context of their chronic comorbidities is felt to place them at high risk for further clinical deterioration. Furthermore, it is not anticipated that the patient will be medically stable for discharge from the hospital within 2 midnights of admission.  ? ?* I certify that at the point of admission it is my clinical judgment that the patient will require inpatient hospital care spanning beyond 2 midnights from the point of admission due to high intensity of service, high risk for further deterioration and high frequency of surveillance required.* ? ?Author: ?Rolla Plate, DO ?07/01/2021 2:01 AM ? ?For on call review [email or web address].  ?

## 2021-07-01 NOTE — Assessment & Plan Note (Addendum)
-   Albumin 2.2 ?- Secondary to acute liver failure and presumed metastatic breast cancer  ?

## 2021-07-01 NOTE — Assessment & Plan Note (Addendum)
-   Blood pressure stable 108/76 ?- Secondary to presumed advanced stage metastatic breast cancer with liver metastases and subsequent cellular and tissue necrosis and presumed infectious colitis  ?-- continue current supportive measures and antibiotics  ?

## 2021-07-01 NOTE — TOC Progression Note (Signed)
?  Transition of Care (TOC) Screening Note ? ? ?Patient Details  ?Name: Jennifer Pollard ?Date of Birth: [birth year] ? ? ?Transition of Care (TOC) CM/SW Contact:    ?Shade Flood, LCSW ?Phone Number: ?07/01/2021, 10:46 AM ? ? ? ?Transition of Care Department [facility]) has reviewed patient and no TOC needs have been identified at this time. We will continue to monitor patient advancement through interdisciplinary progression rounds. If new patient transition needs arise, please place a TOC consult. ? ? ?

## 2021-07-01 NOTE — Assessment & Plan Note (Addendum)
-   Counseled on importance of cessation ?- Patient interested in quitting ?- nicotine patch ?

## 2021-07-01 NOTE — Assessment & Plan Note (Addendum)
-   CT scan shows 2.5 x 2.2 cm right adnexal lesion.  ?-pelvic ultrasound was unrevealing, no mass was seen  ?-Pregnancy test negative ?

## 2021-07-01 NOTE — Assessment & Plan Note (Addendum)
-  AST 63, ALT 717 ?-Alk phos 250  ?- Albumin 2.2 ?- Total bili 6.1 ?- Scleral icterus and ascites on exam ?- Tylenol level less than 10 ?- Alcohol level less than 10 ?- CT scan shows cirrhosis with venous collaterals suggesting portal hypertension.  Innumerable hepatic lesions concerning for metastatic malignancy.  Completed MRI with liver protocol.  Moderate volume simple fluid ascites s/p paracentesis on 4/17.  ?-Follow up AFP, CEA, CA 19-9 ?-Lasix for ascites, urinary response is not adequate, consider paracentesis ?-No family history of liver disease, iron level normal, low suspicion for hemochromatosis ?-GI consultation  ?-Oncology consultation for presumed metastatic breast cancer  ?-spironolactone for portal hypertension ?-Acute hepatitis panel pending ?Continue to monitor  ? ?

## 2021-07-01 NOTE — Progress Notes (Signed)
Left lower quadrant paracentesis procedure completed at this time and 1.7 Liters of clear yellow fluid removed with labs collected and sent for processing. Pt verbalized understanding of post procedure instructions and transported via wheelchair back to inpatient bed assignment at this time  ?

## 2021-07-01 NOTE — Assessment & Plan Note (Addendum)
-   Reports 5-6 episodes of diarrhea prior to arrival ?-CT scan shows ascending and transverse colitis ?-Leukocytosis 12.0 ?-Treating with Cipro Flagyl ?

## 2021-07-01 NOTE — Progress Notes (Signed)
ASSUMPTION OF CARE NOTE  ? ?07/01/2021 ?4:08 PM ? ?Jennifer Pollard was seen and examined.  The H&P by the admitting provider, orders, imaging was reviewed.  Please see new orders.  Will continue to follow.   ? ?36 y.o. female with medical history significant of with history of tobacco use disorder, and marijuana use, presents to the ED with a chief complaint of abdominal swelling.  Patient reports that she had sudden onset of abdominal swelling in April 10.  She thinks she had about 10 pound weight gain.  She does have pain that is in the left lower quadrant that feels crampy and is intermittent.  Patient reports that today she started having diarrhea and had at least 5 episodes.  She denies hematochezia or melena.  She has no known liver problems.  She has no family history of liver disease.  Patient denies any fevers.  She does report a decrease in her urine output that started a few days ago.  She has had no dysuria or hematuria.  Patient denies peripheral edema.  Patient reports that she drinks about 3 times per year.  She has not had right upper quadrant pain.  She denies any high risk sexual behavior, reporting that she has 1 partner that she has been with for some time.  She does not use IV drugs.  Patient has no other complaints at this time. ?  ?Patient smokes cigarettes about a pack per day.  She does request a nicotine patch.  She is ready to quit.  She reports she drinks alcohol about 3 times per year.  She uses marijuana and the last use was 5 PM on the day of presentation.  She is vaccinated for COVID.  Patient is full code. ? ?I have requested GI consultation and spent long time discussing what we know so far with patient who does not seem to grasp, father and call to brother.  I ordered US paracentesis with cytology and other testing as part of work up.  GI team ordered a lot of tests as part of work up.  We don't have answers right now, we are treating supportively and need to wait for more data to  come in.  Pt's liver exam is very impressive.  It is extremely enlarged and very hard to the touch.   ? ?Vitals:  ? 07/01/21 1549 07/01/21 1600  ?BP:  109/64  ?Pulse: (!) 111   ?Resp: (!) 25 (!) 27  ?Temp: 98.7 ?F (37.1 ?C)   ?SpO2: 97%   ? ? ?Results for orders placed or performed during the hospital encounter of 06/27/2021  ?Resp Panel by RT-PCR (Flu A&B, Covid) Nasopharyngeal Swab  ? Specimen: Nasopharyngeal Swab; Nasopharyngeal(NP) swabs in vial transport medium  ?Result Value Ref Range  ? SARS Coronavirus 2 by RT PCR NEGATIVE NEGATIVE  ? Influenza A by PCR NEGATIVE NEGATIVE  ? Influenza B by PCR NEGATIVE NEGATIVE  ?MRSA Next Gen by PCR, Nasal  ? Specimen: Nasal Mucosa; Nasal Swab  ?Result Value Ref Range  ? MRSA by PCR Next Gen NOT DETECTED NOT DETECTED  ?Gram stain  ? Specimen: Peritoneal Washings  ?Result Value Ref Range  ? Specimen Description PERITONEAL ASCITIC   ? Special Requests NONE   ? Gram Stain    ?  NO ORGANISMS SEEN ?WBC PRESENT,BOTH PMN AND MONONUCLEAR ?CYTOSPIN SMEAR ?Performed at [facility], [address] [place], [place] [address] ?  ? Report Status 07/01/2021 FINAL   ?Culture, body fluid w  Gram Stain-bottle  ? Specimen: Peritoneal Washings  ?Result Value Ref Range  ? Specimen Description PERITONEAL ASCITIC   ? Special Requests    ?  Blood Culture adequate volume BOTTLES DRAWN AEROBIC AND ANAEROBIC ?Performed at [facility], [address] [place], [place] [address] ?  ? Culture PENDING   ? Report Status PENDING   ?Lipase, blood  ?Result Value Ref Range  ? Lipase 63 (H) 11 - 51 U/L  ?Comprehensive metabolic panel  ?Result Value Ref Range  ? Sodium 134 (L) 135 - 145 mmol/L  ? Potassium 4.5 3.5 - 5.1 mmol/L  ? Chloride 99 98 - 111 mmol/L  ? CO2 21 (L) 22 - 32 mmol/L  ? Glucose, Bld 73 70 - 99 mg/dL  ? BUN 18 6 - 20 mg/dL  ? Creatinine, Ser 0.83 0.44 - 1.00 mg/dL  ? Calcium 8.8 (L) 8.9 - 10.3 mg/dL  ? Total Protein 6.8 6.5 - 8.1 g/dL  ? Albumin 2.2 (L) 3.5 - 5.0 g/dL  ? AST 717 (H) 15 - 41 U/L   ? ALT <5 0 - 44 U/L  ? Alkaline Phosphatase 250 (H) 38 - 126 U/L  ? Total Bilirubin 6.1 (H) 0.3 - 1.2 mg/dL  ? GFR, Estimated >60 >60 mL/min  ? Anion gap 14 5 - 15  ?CBC  ?Result Value Ref Range  ? WBC 12.0 (H) 4.0 - 10.5 K/uL  ? RBC 4.10 3.87 - 5.11 MIL/uL  ? Hemoglobin 12.3 12.0 - 15.0 g/dL  ? HCT 37.6 36.0 - 46.0 %  ? MCV 91.7 80.0 - 100.0 fL  ? MCH 30.0 26.0 - 34.0 pg  ? MCHC 32.7 30.0 - 36.0 g/dL  ? RDW 21.9 (H) 11.5 - 15.5 %  ? Platelets 157 150 - 400 K/uL  ? nRBC 1.5 (H) 0.0 - 0.2 %  ?Urinalysis, Routine w reflex microscopic  ?Result Value Ref Range  ? Color, Urine AMBER (A) YELLOW  ? APPearance HAZY (A) CLEAR  ? Specific Gravity, Urine 1.023 1.005 - 1.030  ? pH 5.0 5.0 - 8.0  ? Glucose, UA NEGATIVE NEGATIVE mg/dL  ? Hgb urine dipstick NEGATIVE NEGATIVE  ? Bilirubin Urine MODERATE (A) NEGATIVE  ? Ketones, ur 20 (A) NEGATIVE mg/dL  ? Protein, ur 30 (A) NEGATIVE mg/dL  ? Nitrite NEGATIVE NEGATIVE  ? Leukocytes,Ua TRACE (A) NEGATIVE  ? RBC / HPF 0-5 0 - 5 RBC/hpf  ? WBC, UA 6-10 0 - 5 WBC/hpf  ? Bacteria, UA RARE (A) NONE SEEN  ? Squamous Epithelial / LPF 11-20 0 - 5  ? Mucus PRESENT   ? Hyaline Casts, UA PRESENT   ?Pregnancy, urine  ?Result Value Ref Range  ? Preg Test, Ur NEGATIVE NEGATIVE  ?Protime-INR  ?Result Value Ref Range  ? Prothrombin Time 19.6 (H) 11.4 - 15.2 seconds  ? INR 1.7 (H) 0.8 - 1.2  ?Ethanol  ?Result Value Ref Range  ? Alcohol, Ethyl (B) <10 <10 mg/dL  ?Acetaminophen level  ?Result Value Ref Range  ? Acetaminophen (Tylenol), Serum <10 (L) 10 - 30 ug/mL  ?Iron and TIBC  ?Result Value Ref Range  ? Iron 149 28 - 170 ug/dL  ? TIBC 155 (L) 250 - 450 ug/dL  ? Saturation Ratios 96 (H) 10.4 - 31.8 %  ? UIBC 6 ug/dL  ?Hepatitis panel, acute  ?Result Value Ref Range  ? Hepatitis B Surface Ag NON REACTIVE NON REACTIVE  ? HCV Ab NON REACTIVE NON REACTIVE  ? Hep A IgM  NON REACTIVE NON REACTIVE  ? Hep B C IgM NON REACTIVE NON REACTIVE  ?Lactic acid, plasma  ?Result Value Ref Range  ? Lactic Acid, Venous  7.9 (HH) 0.5 - 1.9 mmol/L  ?Lactic acid, plasma  ?Result Value Ref Range  ? Lactic Acid, Venous 8.1 (HH) 0.5 - 1.9 mmol/L  ?Ammonia  ?Result Value Ref Range  ? Ammonia 47 (H) 9 - 35 umol/L  ?HIV Antibody (routine testing w rflx)  ?Result Value Ref Range  ? HIV Screen 4th Generation wRfx Non Reactive Non Reactive  ?Comprehensive metabolic panel  ?Result Value Ref Range  ? Sodium 133 (L) 135 - 145 mmol/L  ? Potassium 3.8 3.5 - 5.1 mmol/L  ? Chloride 98 98 - 111 mmol/L  ? CO2 21 (L) 22 - 32 mmol/L  ? Glucose, Bld 95 70 - 99 mg/dL  ? BUN 18 6 - 20 mg/dL  ? Creatinine, Ser 0.85 0.44 - 1.00 mg/dL  ? Calcium 8.5 (L) 8.9 - 10.3 mg/dL  ? Total Protein 6.7 6.5 - 8.1 g/dL  ? Albumin 2.1 (L) 3.5 - 5.0 g/dL  ? AST 675 (H) 15 - 41 U/L  ? ALT 130 (H) 0 - 44 U/L  ? Alkaline Phosphatase 238 (H) 38 - 126 U/L  ? Total Bilirubin 5.9 (H) 0.3 - 1.2 mg/dL  ? GFR, Estimated >60 >60 mL/min  ? Anion gap 14 5 - 15  ?Magnesium  ?Result Value Ref Range  ? Magnesium 2.3 1.7 - 2.4 mg/dL  ?CBC with Differential/Platelet  ?Result Value Ref Range  ? WBC 12.1 (H) 4.0 - 10.5 K/uL  ? RBC 4.13 3.87 - 5.11 MIL/uL  ? Hemoglobin 12.3 12.0 - 15.0 g/dL  ? HCT 37.9 36.0 - 46.0 %  ? MCV 91.8 80.0 - 100.0 fL  ? MCH 29.8 26.0 - 34.0 pg  ? MCHC 32.5 30.0 - 36.0 g/dL  ? RDW 21.6 (H) 11.5 - 15.5 %  ? Platelets 158 150 - 400 K/uL  ? nRBC 1.4 (H) 0.0 - 0.2 %  ? Neutrophils Relative % 71 %  ? Neutro Abs 8.7 (H) 1.7 - 7.7 K/uL  ? Lymphocytes Relative 13 %  ? Lymphs Abs 1.5 0.7 - 4.0 K/uL  ? Monocytes Relative 15 %  ? Monocytes Absolute 1.8 (H) 0.1 - 1.0 K/uL  ? Eosinophils Relative 0 %  ? Eosinophils Absolute 0.0 0.0 - 0.5 K/uL  ? Basophils Relative 0 %  ? Basophils Absolute 0.0 0.0 - 0.1 K/uL  ? Immature Granulocytes 1 %  ? Abs Immature Granulocytes 0.07 0.00 - 0.07 K/uL  ?TSH  ?Result Value Ref Range  ? TSH 1.740 0.350 - 4.500 uIU/mL  ?Urinalysis, Routine w reflex microscopic Urine, Clean Catch  ?Result Value Ref Range  ? Color, Urine YELLOW YELLOW  ? APPearance  CLEAR CLEAR  ? Specific Gravity, Urine 1.011 1.005 - 1.030  ? pH 5.0 5.0 - 8.0  ? Glucose, UA NEGATIVE NEGATIVE mg/dL  ? Hgb urine dipstick NEGATIVE NEGATIVE  ? Bilirubin Urine NEGATIVE NEGATIVE  ? Ketones,

## 2021-07-01 NOTE — Consult Note (Addendum)
? ?Gastroenterology Consult  ? ?Referring Provider: Dr. Wynetta Emery ?Primary Care Physician:  Lindell Spar, MD ?Primary Gastroenterologist:  Dr. Abbey Chatters, previously unassigned ? ?Patient ID: Jennifer Pollard; [id number]; Sep 07, 1985  ? ?Admit date: 06/21/2021 ? LOS: 0 days  ? ?Date of Consultation: 07/01/2021 ? ?Reason for Consultation:  Acute hepatitis  ? ?History of Present Illness  ? ?Jennifer Pollard is a 36 y.o. year old female presenting to the ED yesterday with abdominal distension starting approximately one week ago, nausea, vomiting, reported heartburn, and diarrhea, with elevated LFTs, CT abd/pelvis with contrast showing cirrhotic liver, portal hypertension, and innumerable hepatic lesions concerning for metastatic malignancy. Moderate volume ascites. Possible soft tissue density right adnexa, and circumferential diffuse bowel wall thickening of ascending and transverse colon. US abdomen completed today again with cirrhosis and innumerable hypodense liver masses concerning for metastatic disease. [place] para with 1.7 liters removed. US pelvis without evidence for adnexal mass.  ? ?Admitting labs with Tbili 6.1, ALk Phos 250, AST 717. Tbili 5.9 this morning, Alk Phos 238, ALT increased to 130, and AST 675. INR 1.7. Acute hepatitis panel negative. HIV antibody negative. CA 19-9, CEA, and AFP tumor marker in process. Fluid analysis from para remains pending. ? ?Patient is a difficult historian. She is unable to lay still in the bed. Notes onset of abdominal swelling on 4/10. Associated nausea, vomiting, and worsening heartburn at that time. Diarrhea 2-3 times per day. Last loose stool yesterday evening. Vomiting a few times this morning. No hematemesis or overt GI bleeding. No confusion. States she used to drink as a teenager but none "in awhile". Unable to quantify how long ago she drank. Other notes in epic report drinking 3 times per year. ED note states drank once per day but has not in several months. +cannabis use.  NO other drugs. No antibiotics. No changes in medications. No mental status changes or confusion. Denies fever/chills. Denies poor appetite. I do note she presented to the ED in  March with concerns for pruritis.  ? ?No family history of liver disease, colorectal cancer, or colon polyps. No known prior history of cirrhosis per patient.  ? ?She is eager to go home. No family at bedside when patient seen.  ? ?Past Medical History:  ?Diagnosis Date  ? Medical history non-contributory   ? ? ?Past Surgical History:  ?Procedure Laterality Date  ? None    ? ? ?Prior to Admission medications   ?Medication Sig Start Date End Date Taking? Authorizing Provider  ?acetaminophen (TYLENOL) 500 MG tablet Take 1,000 mg by mouth every 6 (six) hours as needed for moderate pain.   Yes [provider]  ?cyclobenzaprine (FLEXERIL) 10 MG tablet Take 1 tablet (10 mg total) by mouth 3 (three) times daily as needed. ?Patient not taking: Reported on 07/01/2021 01/16/18   Kem Parkinson, PA-C  ?diclofenac (VOLTAREN) 50 MG EC tablet Take 1 tablet (50 mg total) by mouth 2 (two) times daily. ?Patient not taking: Reported on 07/01/2021 07/25/15   Fransico Meadow, PA-C  ?ibuprofen (ADVIL) 800 MG tablet Take 1 tablet (800 mg total) by mouth 3 (three) times daily. ?Patient not taking: Reported on 07/01/2021 10/27/19   Garald Balding, PA-C  ?predniSONE (STERAPRED UNI-PAK 21 TAB) 10 MG (21) TBPK tablet $RemoveBef'10mg'[id number]$  Tabs, 6 day taper. Use as directed ?Patient not taking: Reported on 07/01/2021 05/27/21   Truddie Hidden, MD  ?traMADol (ULTRAM) 50 MG tablet 1 or 2 po q6h prn pain ?Patient not taking: Reported on  07/01/2021 08/29/14   Lily Kocher, PA-C  ? ? ?Current Facility-Administered Medications  ?Medication Dose Route Frequency Provider Last Rate Last Admin  ? 0.9 %  sodium chloride infusion   Intravenous Continuous Zierle-Ghosh, Asia B, DO   Stopped at 07/01/21 1012  ? alum & mag hydroxide-simeth (MAALOX/MYLANTA) 200-200-20 MG/5ML suspension 30 mL   30 mL Oral Q4H PRN Zierle-Ghosh, Asia B, DO      ? Chlorhexidine Gluconate Cloth 2 % PADS 6 each  6 each Topical Q0600 Murlean Iba, MD   6 each at 07/01/21 0724  ? ciprofloxacin (CIPRO) IVPB 400 mg  400 mg Intravenous Q12H Zierle-Ghosh, Asia B, DO   Stopped at 07/01/21 0327  ? heparin injection 5,000 Units  5,000 Units Subcutaneous Q8H Zierle-Ghosh, Asia B, DO   5,000 Units at 07/01/21 0529  ? ibuprofen (ADVIL) tablet 400 mg  400 mg Oral Q6H PRN Zierle-Ghosh, Asia B, DO      ? metroNIDAZOLE (FLAGYL) IVPB 500 mg  500 mg Intravenous Q12H Zierle-Ghosh, Asia B, DO 100 mL/hr at 07/01/21 1308 500 mg at 07/01/21 1308  ? nicotine (NICODERM CQ - dosed in mg/24 hours) patch 21 mg  21 mg Transdermal Daily Zierle-Ghosh, Asia B, DO   21 mg at 07/01/21 0901  ? ondansetron (ZOFRAN) tablet 4 mg  4 mg Oral Q6H PRN Zierle-Ghosh, Asia B, DO      ? Or  ? ondansetron (ZOFRAN) injection 4 mg  4 mg Intravenous Q6H PRN Zierle-Ghosh, Asia B, DO   4 mg at 07/01/21 0719  ? oxyCODONE (Oxy IR/ROXICODONE) immediate release tablet 5 mg  5 mg Oral Q4H PRN Zierle-Ghosh, Asia B, DO      ? prochlorperazine (COMPAZINE) injection 10 mg  10 mg Intravenous Q4H PRN Brem, Clanford L, MD   10 mg at 07/01/21 1211  ? spironolactone (ALDACTONE) tablet 25 mg  25 mg Oral Daily Zierle-Ghosh, Asia B, DO   25 mg at 07/01/21 0900  ? ? ?Allergies as of 07/12/2021  ? (No Known Allergies)  ? ? ?Family History  ?Problem Relation Age of Onset  ? Colon cancer Neg Hx   ? Colon polyps Neg Hx   ? Liver disease Neg Hx   ? ? ?Social History  ? ?Socioeconomic History  ? Marital status: Single  ?  Spouse name: Not on file  ? Number of children: 1  ? Years of education: Not on file  ? Highest education level: Not on file  ?Occupational History  ? Not on file  ?Tobacco Use  ? Smoking status: Every Day  ?  Packs/day: 1.00  ?  Types: Cigars, Cigarettes  ? Smokeless tobacco: Never  ?Vaping Use  ? Vaping Use: Never used  ?Substance and Sexual Activity  ? Alcohol use: No  ?   Comment: denies currently. Unable to quantify, stating she drank as a teenager. Denies any recent alcohol intake as of 4/17  ? Drug use: Yes  ?  Types: Marijuana  ? Sexual activity: Yes  ?  Birth control/protection: Injection  ?Other Topics Concern  ? Not on file  ?Social History Narrative  ? Not on file  ? ?Social Determinants of Health  ? ?Financial Resource Strain: Not on file  ?Food Insecurity: Not on file  ?Transportation Needs: Not on file  ?Physical Activity: Not on file  ?Stress: Not on file  ?Social Connections: Not on file  ?Intimate Partner Violence: Not on file  ? ? ? ?Review of Systems  ? ?Gen:  Denies any fever, chills, loss of appetite, change in weight or weight loss ?CV: Denies chest pain, heart palpitations, syncope, edema  ?Resp: Denies shortness of breath with rest, cough, wheezing, coughing up blood, and pleurisy. ?GI: see HPI ?GU : Denies urinary burning, blood in urine, urinary frequency, and urinary incontinence. ?MS: Denies joint pain, limitation of movement, swelling, cramps, and atrophy.  ?Derm: Denies rash, itching, dry skin, hives. ?Psych: Denies depression, anxiety, memory loss, hallucinations, and confusion. ?Heme: Denies bruising or bleeding ?Neuro:  Denies any headaches, dizziness, paresthesias, shaking ? ?Physical Exam  ? ?Vital Signs in last 24 hours: ?Temp:  [97.8 ?F (36.6 ?C)-98.4 ?F (36.9 ?C)] 98.4 ?F (36.9 ?C) (04/17 1119) ?Pulse Rate:  [106-119] 113 (04/17 1119) ?Resp:  [16-28] 24 (04/17 1309) ?BP: (99-132)/(62-87) 109/66 (04/17 1100) ?SpO2:  [90 %-100 %] 90 % (04/17 1119) ?Weight:  [68 kg-76.2 kg] 76.2 kg (04/17 0400) ?Last BM Date : 07/01/21 ? ?General:   Alert,  Well-developed, well-nourished, pleasant and cooperative in NAD ?Head:  Normocephalic and atraumatic. ?Eyes:  +scleral icterus ?Ears:  Normal auditory acuity. ?Mouth:  No deformity or lesions, dentition normal. ?Neck:  Supple; no masses ?Lungs:  Clear throughout to auscultation.   No wheezes, crackles, or rhonchi. No  acute distress. ?Heart:  S1 S2 present with tachycardia in the low 100s  ?Abdomen:  +BS, round and full lower abdomen, marked hepatomegaly with firm liver margin palpated across upper abdomen to LUQ,

## 2021-07-01 NOTE — ED Notes (Signed)
Date and time results received: 07/01/21 0041 ?Test:  lactic acid ?Critical Value: 7.9 ? ?Name of Provider Notified: A. Zierle-Ghosh, DO ? ? ?

## 2021-07-02 ENCOUNTER — Inpatient Hospital Stay (HOSPITAL_COMMUNITY): Payer: Medicaid Other

## 2021-07-02 DIAGNOSIS — N6325 Unspecified lump in the left breast, overlapping quadrants: Secondary | ICD-10-CM

## 2021-07-02 DIAGNOSIS — R7401 Elevation of levels of liver transaminase levels: Secondary | ICD-10-CM

## 2021-07-02 DIAGNOSIS — N6320 Unspecified lump in the left breast, unspecified quadrant: Secondary | ICD-10-CM

## 2021-07-02 DIAGNOSIS — K7200 Acute and subacute hepatic failure without coma: Secondary | ICD-10-CM | POA: Diagnosis not present

## 2021-07-02 DIAGNOSIS — R140 Abdominal distension (gaseous): Secondary | ICD-10-CM | POA: Diagnosis not present

## 2021-07-02 DIAGNOSIS — E872 Acidosis, unspecified: Secondary | ICD-10-CM

## 2021-07-02 DIAGNOSIS — N9489 Other specified conditions associated with female genital organs and menstrual cycle: Secondary | ICD-10-CM | POA: Diagnosis not present

## 2021-07-02 DIAGNOSIS — K529 Noninfective gastroenteritis and colitis, unspecified: Secondary | ICD-10-CM | POA: Diagnosis not present

## 2021-07-02 LAB — CBC WITH DIFFERENTIAL/PLATELET
Abs Immature Granulocytes: 0.17 10*3/uL — ABNORMAL HIGH (ref 0.00–0.07)
Basophils Absolute: 0.1 10*3/uL (ref 0.0–0.1)
Basophils Relative: 0 %
Eosinophils Absolute: 0.6 10*3/uL — ABNORMAL HIGH (ref 0.0–0.5)
Eosinophils Relative: 4 %
HCT: 31.7 % — ABNORMAL LOW (ref 36.0–46.0)
Hemoglobin: 10.6 g/dL — ABNORMAL LOW (ref 12.0–15.0)
Immature Granulocytes: 1 %
Lymphocytes Relative: 15 %
Lymphs Abs: 2 10*3/uL (ref 0.7–4.0)
MCH: 30.4 pg (ref 26.0–34.0)
MCHC: 33.4 g/dL (ref 30.0–36.0)
MCV: 90.8 fL (ref 80.0–100.0)
Monocytes Absolute: 2.6 10*3/uL — ABNORMAL HIGH (ref 0.1–1.0)
Monocytes Relative: 19 %
Neutro Abs: 8.3 10*3/uL — ABNORMAL HIGH (ref 1.7–7.7)
Neutrophils Relative %: 61 %
Platelets: 134 10*3/uL — ABNORMAL LOW (ref 150–400)
RBC: 3.49 MIL/uL — ABNORMAL LOW (ref 3.87–5.11)
RDW: 21.9 % — ABNORMAL HIGH (ref 11.5–15.5)
WBC: 13.6 10*3/uL — ABNORMAL HIGH (ref 4.0–10.5)
nRBC: 2.9 % — ABNORMAL HIGH (ref 0.0–0.2)

## 2021-07-02 LAB — COMPREHENSIVE METABOLIC PANEL
ALT: 126 U/L — ABNORMAL HIGH (ref 0–44)
AST: 657 U/L — ABNORMAL HIGH (ref 15–41)
Albumin: 1.9 g/dL — ABNORMAL LOW (ref 3.5–5.0)
Alkaline Phosphatase: 215 U/L — ABNORMAL HIGH (ref 38–126)
Anion gap: 13 (ref 5–15)
BUN: 22 mg/dL — ABNORMAL HIGH (ref 6–20)
CO2: 19 mmol/L — ABNORMAL LOW (ref 22–32)
Calcium: 8.2 mg/dL — ABNORMAL LOW (ref 8.9–10.3)
Chloride: 97 mmol/L — ABNORMAL LOW (ref 98–111)
Creatinine, Ser: 0.83 mg/dL (ref 0.44–1.00)
GFR, Estimated: >60 mL/min (ref >60)
Glucose, Bld: 95 mg/dL (ref 70–99)
Potassium: 4.1 mmol/L (ref 3.5–5.1)
Sodium: 129 mmol/L — ABNORMAL LOW (ref 135–145)
Total Bilirubin: 6.2 mg/dL — ABNORMAL HIGH (ref 0.3–1.2)
Total Protein: 6.1 g/dL — ABNORMAL LOW (ref 6.5–8.1)

## 2021-07-02 LAB — PROTIME-INR
INR: 1.8 — ABNORMAL HIGH (ref 0.8–1.2)
Prothrombin Time: 20.7 seconds — ABNORMAL HIGH (ref 11.4–15.2)

## 2021-07-02 LAB — GASTROINTESTINAL PANEL BY PCR, STOOL (REPLACES STOOL CULTURE)

## 2021-07-02 LAB — ANTI-SMOOTH MUSCLE ANTIBODY, IGG: F-Actin IgG: 16 Units (ref 0–19)

## 2021-07-02 LAB — TRIGLYCERIDES, BODY FLUIDS: Triglycerides, Fluid: 25 mg/dL

## 2021-07-02 LAB — MISC LABCORP TEST (SEND OUT): Labcorp test code: 9985

## 2021-07-02 LAB — IGG, IGA, IGM
IgA: 595 mg/dL — ABNORMAL HIGH (ref 87–352)
IgG (Immunoglobin G), Serum: 2195 mg/dL — ABNORMAL HIGH (ref 586–1602)
IgM (Immunoglobulin M), Srm: 1095 mg/dL — ABNORMAL HIGH (ref 26–217)

## 2021-07-02 LAB — AMYLASE, BODY FLUID (OTHER): Amylase, Body Fluid: 46 U/L

## 2021-07-02 LAB — CEA: CEA: 361 ng/mL — ABNORMAL HIGH (ref 0.0–4.7)

## 2021-07-02 LAB — MITOCHONDRIAL ANTIBODIES: Mitochondrial M2 Ab, IgG: <20 Units (ref 0.0–20.0)

## 2021-07-02 LAB — ALPHA-1-ANTITRYPSIN: A-1 Antitrypsin, Ser: 221 mg/dL — ABNORMAL HIGH (ref 100–188)

## 2021-07-02 LAB — ANA: Anti Nuclear Antibody (ANA): POSITIVE — AB

## 2021-07-02 LAB — CERULOPLASMIN: Ceruloplasmin: 35.1 mg/dL (ref 19.0–39.0)

## 2021-07-02 LAB — AFP TUMOR MARKER: AFP, Serum, Tumor Marker: 2 ng/mL (ref 0.0–6.4)

## 2021-07-02 LAB — CANCER ANTIGEN 19-9: CA 19-9: <2 U/mL (ref 0–35)

## 2021-07-02 MED ORDER — LIDOCAINE-EPINEPHRINE (PF) 1 %-1:200000 IJ SOLN
INTRAMUSCULAR | Status: AC
  Filled 2021-07-02: qty 30

## 2021-07-02 MED ORDER — LIDOCAINE-EPINEPHRINE (PF) 1 %-1:200000 IJ SOLN
INTRAMUSCULAR | Status: AC
  Administered 2021-07-02: 10 mL
  Filled 2021-07-02: qty 30

## 2021-07-02 MED ORDER — LIDOCAINE HCL (PF) 2 % IJ SOLN
INTRAMUSCULAR | Status: AC
  Administered 2021-07-02: 10 mL
  Filled 2021-07-02: qty 10

## 2021-07-02 MED ORDER — PHYTONADIONE 5 MG PO TABS
10.0000 mg | ORAL_TABLET | Freq: Once | ORAL | Status: AC
  Administered 2021-07-02: 10 mg via ORAL
  Filled 2021-07-02: qty 2

## 2021-07-02 MED ORDER — LIDOCAINE HCL (PF) 2 % IJ SOLN
INTRAMUSCULAR | Status: AC
  Filled 2021-07-02: qty 10

## 2021-07-02 NOTE — Progress Notes (Signed)
?PROGRESS NOTE ? ? ?Jennifer Pollard  XAJ:[id number] DOB: [birth year] DOA: 07/08/2021 ?PCP: Lindell Spar, MD  ? ?Chief Complaint  ?Patient presents with  ? Abdominal Pain  ? ?Level of care: Telemetry ? ?Brief Admission History:  ?36 y.o. female with medical history significant of with history of tobacco use disorder, and marijuana use, presents to the ED with a chief complaint of abdominal swelling.  Patient reports that she had sudden onset of abdominal swelling in April 10.  She thinks she had about 10 pound weight gain.  She does have pain that is in the left lower quadrant that feels crampy and is intermittent.  Patient reports that today she started having diarrhea and had at least 5 episodes.  She denies hematochezia or melena.  She has no known liver problems.  She has no family history of liver disease.  Patient denies any fevers.  She does report a decrease in her urine output that started a few days ago.  She has had no dysuria or hematuria.  Patient denies peripheral edema.  Patient reports that she drinks about 3 times per year.  She has not had right upper quadrant pain.  She denies any high risk sexual behavior, reporting that she has 1 partner that she has been with for some time.  She does not use IV drugs.  Patient has no other complaints at this time. ?  ?Patient smokes cigarettes about a pack per day.  She does request a nicotine patch.  She is ready to quit.  She reports she drinks alcohol about 3 times per year.  She uses marijuana and the last use was 5 PM on the day of presentation.  She is vaccinated for COVID.  Patient is full code. ?  ?I have requested GI consultation and spent long time discussing what we know so far with patient who does not seem to grasp, father and call to brother.  I ordered US paracentesis with cytology and other testing as part of work up.  GI team ordered a lot of tests as part of work up.  We don't have answers right now, we are treating supportively and need to wait  for more data to come in.  Pt's liver exam is very impressive.  It is extremely enlarged and very hard to the touch.  Pt also has a large left breast mass.   ? ?07/02/2021: Pt had left breast core biospy done at [facility].  Pt scheduled to have liver biopsy at [facility] IR on 4/19.  Oncologist Dr. Delton Coombes consulted.   ?  ?Assessment and Plan: ?* Acute liver failure ?- AST 63, ALT 717 ?-Alk phos 250  ?- Albumin 2.2 ?- Total bili 6.1 ?- Scleral icterus and ascites on exam ?- Tylenol level less than 10 ?- Alcohol level less than 10 ?- CT scan shows cirrhosis with venous collaterals suggesting portal hypertension.  Innumerable hepatic lesions concerning for metastatic malignancy.  Completed MRI with liver protocol.  Moderate volume simple fluid ascites s/p paracentesis on 4/17.  ?-Follow up AFP, CEA, CA 19-9 ?-Lasix for ascites, urinary response is not adequate, consider paracentesis ?-No family history of liver disease, iron level normal, low suspicion for hemochromatosis ?-GI consultation  ?-Oncology consultation for presumed metastatic breast cancer  ?-spironolactone for portal hypertension ?-Acute hepatitis panel pending ?Continue to monitor  ? ? ?Left breast mass ?-- highly suspicious of undiagnosed breast cancer ?-- I was able to arrange for a core biopsy of left breast on 4/18 ?--  follow up results ?-- arranging liver biopsy on 4/19 with IR at [facility] ? ?Severe Lactic acidosis  ?- Blood pressure stable 108/76 ?- Secondary to presumed advanced stage metastatic breast cancer with liver metastases and subsequent cellular and tissue necrosis and presumed infectious colitis  ?-- continue current supportive measures and antibiotics  ? ?Abdominal distension ?--secondary to hepatomegaly and ascites ?-- pt had paracentesis done on 4/17 with 1.7L fluid removed and sent for testing ? ?Adnexal mass ?- CT scan shows 2.5 x 2.2 cm right adnexal lesion.  ?-pelvic ultrasound was unrevealing, no mass was seen  ?-Pregnancy test  negative ? ?Colitis ?- Reports 5-6 episodes of diarrhea prior to arrival ?-CT scan shows ascending and transverse colitis ?-Leukocytosis 12.0 ?-Treating with Cipro Flagyl ? ?Hypoalbuminemia ?- Albumin 2.2 ?- Secondary to acute liver failure and presumed metastatic breast cancer  ? ?Tobacco use ?- Counseled on importance of cessation ?- Patient interested in quitting ?- nicotine patch ? ?DVT prophylaxis: SCDs, heparin stopped for planned procedures  ?Code Status: Full  ?Family Communication: mother, father, brother updated 4/17, 4/18  ?Disposition: Status is: Inpatient ?Remains inpatient appropriate because: ongoing work up for metastatic breast cancer  ?  ?Consultants:  ?GI ?Oncology  ?IR ?Procedures:  ?Left breast biopsy 07/02/21 at [facility] ?Tentative IR liver biopsy at [facility] on 4/19 ?Antimicrobials:  ?Cipro >> ?Flagyl  >> ?Subjective: ?Pt reports that she wants to go home.  She says that she has some pain in left breast.   ?Objective: ?Vitals:  ? 07/02/21 0900 07/02/21 1000 07/02/21 1102 07/02/21 1325  ?BP: 123/78   (!) 91/55  ?Pulse: 83 (!) 106    ?Resp: 14     ?Temp:   98.5 ?F (36.9 ?C)   ?TempSrc:   Oral   ?SpO2:  95%    ?Weight:      ?Height:      ? ? ?Intake/Output Summary (Last 24 hours) at 07/02/2021 1659 ?Last data filed at 07/02/2021 1504 ?Gross per 24 hour  ?Intake 1683.08 ml  ?Output 1000 ml  ?Net 683.08 ml  ? ?Filed Weights  ? 06/20/2021 2026 07/01/21 0400 07/02/21 0510  ?Weight: 68 kg 76.2 kg 76.2 kg  ? ?Examination: ? ?General exam: awake, alert, cooperative, Appears calm and comfortable  ?Respiratory system: Clear to auscultation. Respiratory effort normal. ?Breast: left breast examined with chaperone present RN [place] Foley; complete left breast hard as a rock. No drainage seen.   ?Cardiovascular system: normal S1 & S2 heard. No JVD, murmurs, rubs, gallops or clicks. No pedal edema. ?Gastrointestinal system: Abdomen is nondistended, soft and nontender. No organomegaly or masses felt. Normal bowel  sounds heard. ?Central nervous system: Alert and oriented. No focal neurological deficits. ?Extremities: Symmetric 5 x 5 power. ?Skin: No rashes, lesions or ulcers. ?Psychiatry: Judgement and insight appear normal. Mood & affect appropriate.  ? ?Data Reviewed: I have personally reviewed following labs and imaging studies ? ?CBC: ?Recent Labs  ?Lab 06/28/2021 ?2115 07/01/21 ?0326 07/02/21 ?0507  ?WBC 12.0* 12.1* 13.6*  ?NEUTROABS  --  8.7* 8.3*  ?HGB 12.3 12.3 10.6*  ?HCT 37.6 37.9 31.7*  ?MCV 91.7 91.8 90.8  ?PLT 157 158 134*  ? ? ?Basic Metabolic Panel: ?Recent Labs  ?Lab 06/29/2021 ?2115 07/01/21 ?0326 07/02/21 ?0507  ?NA 134* 133* 129*  ?K 4.5 3.8 4.1  ?CL 99 98 97*  ?CO2 21* 21* 19*  ?GLUCOSE 73 95 95  ?BUN 18 18 22*  ?CREATININE 0.83 0.85 0.83  ?CALCIUM 8.8* 8.5* 8.2*  ?  MG  --  2.3  --   ? ? ?CBG: ?No results for input(s): GLUCAP in the last 168 hours. ? ?Recent Results (from the past 240 hour(s))  ?Resp Panel by RT-PCR (Flu A&B, Covid) Nasopharyngeal Swab     Status: None  ? Collection Time: 06/22/2021 11:54 PM  ? Specimen: Nasopharyngeal Swab; Nasopharyngeal(NP) swabs in vial transport medium  ?Result Value Ref Range Status  ? SARS Coronavirus 2 by RT PCR NEGATIVE NEGATIVE Final  ?  Comment: (NOTE) ?SARS-CoV-2 target nucleic acids are NOT DETECTED. ? ?The SARS-CoV-2 RNA is generally detectable in upper respiratory ?specimens during the acute phase of infection. The lowest ?concentration of SARS-CoV-2 viral copies this assay can detect is ?138 copies/mL. A negative result does not preclude SARS-Cov-2 ?infection and should not be used as the sole basis for treatment or ?other patient management decisions. A negative result may occur with  ?improper specimen collection/handling, submission of specimen other ?than nasopharyngeal swab, presence of viral mutation(s) within the ?areas targeted by this assay, and inadequate number of viral ?copies(<138 copies/mL). A negative result must be combined with ?clinical  observations, patient history, and epidemiological ?information. The expected result is Negative. ? ?Fact Sheet for Patients:  ?EntrepreneurPulse.com.au ? ?Fact Sheet for Healthcare Providers:  ?https://www.

## 2021-07-02 NOTE — Assessment & Plan Note (Signed)
--  secondary to hepatomegaly and ascites ?-- pt had paracentesis done on 4/17 with 1.7L fluid removed and sent for testing ?

## 2021-07-02 NOTE — Progress Notes (Signed)
Patient tolerated left breast biopsy well today and verbalized understanding of post procedure instructions. PT returned to inpatient bed assignment at this time via stretcher with no acute distress noted. ?

## 2021-07-02 NOTE — Progress Notes (Signed)
Report called and given to [place] LPN on Dept. [id number]. Pt to be wheeled to room 333 via WC once room is clean.  ?

## 2021-07-02 NOTE — Consult Note (Signed)
[facility] ?Consultation Oncology ? ?Name: Jennifer Pollard      MRN: [id number]    Location: IC05/IC05-01  Date: 07/02/2021 Time:4:39 PM ? ? ?REFERRING PHYSICIAN: Dr. Wynetta Emery ? 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  ? ?HISTORY OF PRESENT ILLNESS: Jennifer Pollard is a 36 year old very pleasant African-American female who is seen in consultation today at the request of Dr. Wynetta Emery for further management of left breast mass and multiple liver metastasis.  CTAP with contrast on 06/17/2021 showed enlarged liver with innumerable liver lesions concerning for metastatic disease.  Soft tissue density in the right adnexal lesion.  MRI of the abdomen with and without contrast on 07/01/2021 showed left breast mass, hepatomegaly, innumerable liver lesions, small volume ascites.  LFTs showed total bilirubin of 6.1 with elevated AST, ALT and alkaline phosphatase.  Patient reports that she had breast mass for about a year.  She denies any abdominal pain at this time.  She works at the [employer] as a Quarry manager.  She is a current active smoker.  She has a 41-year-old son.  Family history significant for father with prostate cancer, paternal grandmother with pancreatic cancer, paternal aunt with breast cancer. ? ?PAST MEDICAL HISTORY:   ?Past Medical History:  ?Diagnosis Date  ? Medical history non-contributory   ? ? ?ALLERGIES: ?No Known Allergies ?   ?MEDICATIONS: I have reviewed the patient's current medications.   ?  ?PAST SURGICAL HISTORY ?Past Surgical History:  ?Procedure Laterality Date  ? None    ? ? ?FAMILY HISTORY: ?Family History  ?Problem Relation Age of Onset  ? Colon cancer Neg Hx   ? Colon polyps Neg Hx   ? Liver disease Neg Hx   ? ? ?SOCIAL HISTORY: ? reports that she has been smoking cigars. She has been smoking an average of 1 pack per day. She has never used smokeless tobacco. She reports current drug use. Drug: Marijuana. She reports that she does not drink  alcohol. ? ?PERFORMANCE STATUS: ?The patient's performance status is 2 - Symptomatic, <50% confined to bed ? ?PHYSICAL EXAM: ?Most Recent Vital Signs: Blood pressure (!) 91/55, pulse (!) 106, temperature 98.5 ?F (36.9 ?C), temperature source Oral, resp. rate 14, height '5\' 4"'$  (1.626 m), weight 167 lb 15.9 oz (76.2 kg), last menstrual period 06/10/2021, SpO2 95 %. ?BP (!) 91/55   Pulse (!) 106   Temp 98.5 ?F (36.9 ?C) (Oral)   Resp 14   Ht '5\' 4"'$  (1.626 m)   Wt 167 lb 15.9 oz (76.2 kg)   LMP 06/10/2021   SpO2 95%   BMI 28.84 kg/m?  ?General appearance: alert, cooperative, and appears stated age ?Lungs: clear to auscultation bilaterally ?Breasts:  Left breast mass involving the whole breast, freely mobile over the chest wall.  Small left supraclavicular lymph nodes palpable.  Bandage over the biopsy site in the medial part of the breast. ?Heart: regular rate and rhythm ?Abdomen: Hepatomegaly extending up to just above the umbilicus in the midline. ?Extremities:  No edema or cyanosis. ?Lymph nodes:  Left supraclavicular lymph nodes palpable.  Small left axillary lymph nodes palpable. ?Neurologic: Grossly normal ? ?LABORATORY DATA:  ?Results for orders placed or performed during the hospital encounter of 06/21/2021 (from the past 48 hour(s))  ?Lipase, blood     Status: Abnormal  ? Collection Time: 07/11/2021  9:15 PM  ?Result Value Ref Range  ? Lipase 63 (H) 11 - 51 U/L  ?  Comment: Performed  at [facility], [address], [place], [place] [address]  ?Comprehensive metabolic panel     Status: Abnormal  ? Collection Time: 07/09/2021  9:15 PM  ?Result Value Ref Range  ? Sodium 134 (L) 135 - 145 mmol/L  ? Potassium 4.5 3.5 - 5.1 mmol/L  ? Chloride 99 98 - 111 mmol/L  ? CO2 21 (L) 22 - 32 mmol/L  ? Glucose, Bld 73 70 - 99 mg/dL  ?  Comment: Glucose reference range applies only to samples taken after fasting for at least 8 hours.  ? BUN 18 6 - 20 mg/dL  ? Creatinine, Ser 0.83 0.44 - 1.00 mg/dL  ? Calcium 8.8 (L) 8.9 - 10.3  mg/dL  ? Total Protein 6.8 6.5 - 8.1 g/dL  ? Albumin 2.2 (L) 3.5 - 5.0 g/dL  ? AST 717 (H) 15 - 41 U/L  ? ALT <5 0 - 44 U/L  ? Alkaline Phosphatase 250 (H) 38 - 126 U/L  ? Total Bilirubin 6.1 (H) 0.3 - 1.2 mg/dL  ? GFR, Estimated >60 >60 mL/min  ?  Comment: (NOTE) ?Calculated using the CKD-EPI Creatinine Equation (2021) ?  ? Anion gap 14 5 - 15  ?  Comment: Performed at [facility], [address] [place], [place] [address]  ?CBC     Status: Abnormal  ? Collection Time: 07/11/2021  9:15 PM  ?Result Value Ref Range  ? WBC 12.0 (H) 4.0 - 10.5 K/uL  ? RBC 4.10 3.87 - 5.11 MIL/uL  ? Hemoglobin 12.3 12.0 - 15.0 g/dL  ? HCT 37.6 36.0 - 46.0 %  ? MCV 91.7 80.0 - 100.0 fL  ? MCH 30.0 26.0 - 34.0 pg  ? MCHC 32.7 30.0 - 36.0 g/dL  ? RDW 21.9 (H) 11.5 - 15.5 %  ? Platelets 157 150 - 400 K/uL  ? nRBC 1.5 (H) 0.0 - 0.2 %  ?  Comment: Performed at [facility], [address] [place], [place] [address]  ?Protime-INR     Status: Abnormal  ? Collection Time: 06/29/2021  9:15 PM  ?Result Value Ref Range  ? Prothrombin Time 19.6 (H) 11.4 - 15.2 seconds  ? INR 1.7 (H) 0.8 - 1.2  ?  Comment: (NOTE) ?INR goal varies based on device and disease states. ?Performed at [facility], [address], [place], [place] [address] ?  ?Urinalysis, Routine w reflex microscopic     Status: Abnormal  ? Collection Time: 07/04/2021  9:35 PM  ?Result Value Ref Range  ? Color, Urine AMBER (A) YELLOW  ?  Comment: BIOCHEMICALS MAY BE AFFECTED BY COLOR  ? APPearance HAZY (A) CLEAR  ? Specific Gravity, Urine 1.023 1.005 - 1.030  ? pH 5.0 5.0 - 8.0  ? Glucose, UA NEGATIVE NEGATIVE mg/dL  ? Hgb urine dipstick NEGATIVE NEGATIVE  ? Bilirubin Urine MODERATE (A) NEGATIVE  ? Ketones, ur 20 (A) NEGATIVE mg/dL  ? Protein, ur 30 (A) NEGATIVE mg/dL  ? Nitrite NEGATIVE NEGATIVE  ? Leukocytes,Ua TRACE (A) NEGATIVE  ? RBC / HPF 0-5 0 - 5 RBC/hpf  ? WBC, UA 6-10 0 - 5 WBC/hpf  ? Bacteria, UA RARE (A) NONE SEEN  ? Squamous Epithelial / LPF 11-20 0 - 5  ? Mucus PRESENT   ? Hyaline  Casts, UA PRESENT   ?  Comment: Performed at [facility], [address] [place],  [address]  ?Pregnancy, urine     Status: None  ? Collection Time: 06/15/2021  9:35 PM  ?Result  Value Ref Range  ? Preg Test, Ur NEGATIVE NEGATIVE  ?  Comment:        ?THE SENSITIVITY OF THIS ?METHODOLOGY IS >20 mIU/mL. ?Performed at [facility], [address] [place], [place] [address] ?  ?Ethanol     Status: None  ? Collection Time: 06/29/2021  9:59 PM  ?Result Value Ref Range  ? Alcohol, Ethyl (B) <10 <10 mg/dL  ?  Comment: (NOTE) ?Lowest detectable limit for serum alcohol is 10 mg/dL. ? ?For medical purposes only. ?Performed at [facility], [address], [place], [place] [address] ?  ?Acetaminophen level     Status: Abnormal  ? Collection Time: 06/19/2021  9:59 PM  ?Result Value Ref Range  ? Acetaminophen (Tylenol), Serum <10 (L) 10 - 30 ug/mL  ?  Comment: (NOTE) ?Therapeutic concentrations vary significantly. A range of 10-30 ug/mL  ?may be an effective concentration for many patients. However, some  ?are best treated at concentrations outside of this range. ?Acetaminophen concentrations >150 ug/mL at 4 hours after ingestion  ?and >50 ug/mL at 12 hours after ingestion are often associated with  ?toxic reactions. ? ?Performed at [facility], [address] [place], [place] [address] ?  ?Resp Panel by RT-PCR (Flu A&B, Covid) Nasopharyngeal Swab     Status: None  ? Collection Time: 06/16/2021 11:54 PM  ? Specimen: Nasopharyngeal Swab; Nasopharyngeal(NP) swabs in vial transport medium  ?Result Value Ref Range  ? SARS Coronavirus 2 by RT PCR NEGATIVE NEGATIVE  ?  Comment: (NOTE) ?SARS-CoV-2 target nucleic acids are NOT DETECTED. ? ?The SARS-CoV-2 RNA is generally detectable in upper respiratory ?specimens during the acute phase of infection. The lowest ?concentration of SARS-CoV-2 viral copies this assay can detect is ?138 copies/mL. A negative result does not preclude SARS-Cov-2 ?infection and should not be used as the sole basis  for treatment or ?other patient management decisions. A negative result may occur with  ?improper specimen collection/handling, submission of specimen other ?than nasopharyngeal swab, presence of viral mutation(s) withi

## 2021-07-02 NOTE — Progress Notes (Signed)
Transport arranged for IR appt tomorrow, 07/03/21 to be at [facility] at 0830. [facility] to be here around 0730 to transport patient according to scheduler.  ?

## 2021-07-02 NOTE — Assessment & Plan Note (Signed)
--   highly suspicious of undiagnosed breast cancer ?-- I was able to arrange for a core biopsy of left breast on 4/18 ?-- follow up results ?-- arranging liver biopsy on 4/19 with IR at [facility] ?

## 2021-07-02 NOTE — Progress Notes (Signed)
Pt transported down via transport staff for breast biopsy.  ?

## 2021-07-02 NOTE — Progress Notes (Signed)
? ? ?Subjective: ?Reports she is feeling better today. No abdominal pain, nausea, vomiting, diarrhea, brbpr, or melena. Denies hematemesis or coffee ground emesis prior. No known family history of liver disease, liver, breast, or colon cancer.  ? ?She does tell me that she had been noticing pain and swelling in her left breast and that she made an appointment with her primary care doctor on 4/20 for this.  ? ? ?Objective: ?Vital signs in last 24 hours: ?Temp:  [98.3 ?F (36.8 ?C)-98.8 ?F (37.1 ?C)] 98.3 ?F (36.8 ?C) (04/18 0720) ?Pulse Rate:  [103-125] 103 (04/18 0720) ?Resp:  [15-29] 26 (04/18 0720) ?BP: (80-139)/(43-87) 103/63 (04/18 0700) ?SpO2:  [89 %-100 %] 98 % (04/18 0720) ?Weight:  [76.2 kg] 76.2 kg (04/18 0510) ?Last BM Date : 07/01/21 ?General:   Alert and oriented, pleasant, NAD, laying in bed comfortably.  ?Head:  Normocephalic and atraumatic. ?Eyes: + scleral icterus.   ?Abdomen:  Bowel sounds present. Abdomen is full but soft and non tender to palpation. Significant hepatomegaly with firm liver margin palpated across the upper abdomen to LUQ.    ?Msk:  Symmetrical without gross deformities. Normal posture. ?Extremities:  Without edema. ?Neurologic:  Alert and  oriented x4. No asterixis.  ?Skin:  Warm and dry, intact without significant lesions.  ?Psych:  Normal mood and affect. ? ?Intake/Output from previous day: ?04/17 0701 - 04/18 0700 ?In: 1514.3 [I.V.:914.2; IV Piggyback:600.1] ?Out: 500 [Urine:500] ?Intake/Output this shift: ?No intake/output data recorded. ? ?Lab Results: ?Recent Labs  ?  07/01/2021 ?2115 07/01/21 ?0326 07/02/21 ?0507  ?WBC 12.0* 12.1* 13.6*  ?HGB 12.3 12.3 10.6*  ?HCT 37.6 37.9 31.7*  ?PLT 157 158 134*  ? ?BMET ?Recent Labs  ?  06/28/2021 ?2115 07/01/21 ?0326 07/02/21 ?0507  ?NA 134* 133* 129*  ?K 4.5 3.8 4.1  ?CL 99 98 97*  ?CO2 21* 21* 19*  ?GLUCOSE 73 95 95  ?BUN 18 18 22*  ?CREATININE 0.83 0.85 0.83  ?CALCIUM 8.8* 8.5* 8.2*  ? ?LFT ?Recent Labs  ?  07/01/2021 ?2115 07/01/21 ?0326  07/02/21 ?0507  ?PROT 6.8 6.7 6.1*  ?ALBUMIN 2.2* 2.1* 1.9*  ?AST 717* 675* 657*  ?ALT <5 130* 126*  ?ALKPHOS 250* 238* 215*  ?BILITOT 6.1* 5.9* 6.2*  ? ?PT/INR ?Recent Labs  ?  07/01/21 ?1250 07/02/21 ?0507  ?LABPROT 20.1* 20.7*  ?INR 1.7* 1.8*  ? ?Hepatitis Panel ?Recent Labs  ?  07/01/21 ?0006  ?HEPBSAG NON REACTIVE  ?HCVAB NON REACTIVE  ?HEPAIGM NON REACTIVE  ?HEPBIGM NON REACTIVE  ? ? ?Studies/Results: ?MR ABDOMEN W WO CONTRAST ? ?Result Date: 07/01/2021 ?CLINICAL DATA:  Inpatient. Ascites. Elevated liver function tests. Liver masses. EXAM: MRI ABDOMEN WITHOUT AND WITH CONTRAST TECHNIQUE: Multiplanar multisequence MR imaging of the abdomen was performed both before and after the administration of intravenous contrast. CONTRAST:  38m GADAVIST GADOBUTROL 1 MMOL/ML IV SOLN COMPARISON:  06/29/2021 CT abdomen/pelvis. FINDINGS: Substantially motion degraded study, significantly limiting assessment. Lower chest: No acute abnormality at the lung bases. Hepatobiliary: Hepatomegaly. Liver parenchyma is nearly entirely replaced by innumerable confluent bulky hypoenhancing liver masses throughout the liver, which distort the liver capsule. Representative liver masses as follows: -posterosuperior right liver 12.5 x 12.0 cm mass (series 14/image 42), likely representing multiple confluent masses -segment 8 right liver 4.0 x 2.6 cm mass (series 14/image 50) -segment 2 left liver 4.4 x 4.1 cm mass (series 14/image 50) -segment 4B left liver 6.2 x 5.4 cm mass (series 14/image 69) No discrete cholelithiasis. No gallbladder distention.  Mild diffuse gallbladder wall thickening. No biliary ductal dilatation. Common bile duct diameter 2 mm. No evidence of choledocholithiasis. Pancreas: No pancreatic mass or duct dilation. Spleen: Normal size. No mass. Adrenals/Urinary Tract: Normal adrenals. No hydronephrosis. Normal kidneys with no renal mass. Stomach/Bowel: Small hiatal hernia. Otherwise normal nondistended stomach. Normal caliber  small and large bowel. Mild wall thickening throughout the visualized right colon. No definite small bowel wall thickening. Vascular/Lymphatic: Normal caliber abdominal aorta. Patent renal, portal and splenic veins. Extrinsic narrowing of the hepatic veins, which appear patent. No pathologically enlarged lymph nodes in the abdomen. Other: Small volume ascites. No focal fluid collection. Abnormal asymmetric poorly defined masslike hyperenhancement throughout the left breast parenchyma (series 14/image 18). Musculoskeletal: No aggressive appearing focal osseous lesions. IMPRESSION: 1. Abnormal asymmetric poorly defined masslike hyperenhancement throughout the left breast parenchyma, highly suspicious for left breast primary malignancy. Diagnostic mammographic evaluation recommended when clinically feasible. 2. Hepatomegaly. Innumerable confluent hypoenhancing liver masses throughout the liver, which distort the liver capsule. These findings are most compatible with bulky liver metastases. Percutaneous liver mass biopsy suggested for diagnosis. 3. Small volume ascites. 4. Mild diffuse gallbladder wall thickening, nonspecific, probably due to noninflammatory edema. No cholelithiasis. No biliary ductal dilatation. 5. Mild wall thickening throughout the visualized right colon, nonspecific, favor noninflammatory edema. 6. Small hiatal hernia. Electronically Signed   By: Ilona Sorrel M.D.   On: 07/01/2021 20:20  ? ?US Abdomen Complete ? ?Result Date: 07/01/2021 ?CLINICAL DATA:  Acute liver failure. EXAM: ABDOMEN ULTRASOUND COMPLETE COMPARISON:  CT abdomen pelvis from yesterday. FINDINGS: Gallbladder: No gallstones. Moderate wall thickening with trace pericholecystic fluid. No sonographic Murphy sign noted by sonographer. Common bile duct: Diameter: 3 mm, normal. Liver: Nodular contour. Markedly heterogeneous parenchymal echogenicity with innumerable hypodense masses. Portal vein is patent on color Doppler imaging with normal  direction of blood flow towards the liver. IVC: No abnormality visualized. Pancreas: Not well visualized due to overlying bowel gas. Spleen: Size and appearance within normal limits. Right Kidney: Length: 12.3 cm. Echogenicity within normal limits. No mass or hydronephrosis visualized. Left Kidney: Length: 11.6 cm. Echogenicity within normal limits. No mass or hydronephrosis visualized. Abdominal aorta: No aneurysm visualized. Other findings: Small upper abdominal ascites. IMPRESSION: 1. Cirrhosis with innumerable hypodense liver masses concerning for metastatic disease. 2. Small upper abdominal ascites. 3. Moderate gallbladder wall thickening without cholelithiasis, likely related to underlying liver disease. Electronically Signed   By: Titus Dubin M.D.   On: 07/01/2021 09:36  ? ?US PELVIS (TRANSABDOMINAL ONLY) ? ?Result Date: 07/01/2021 ?CLINICAL DATA:  Right adnexal mass EXAM: TRANSABDOMINAL AND TRANSVAGINAL ULTRASOUND OF PELVIS TECHNIQUE: Both transabdominal and transvaginal ultrasound examinations of the pelvis were performed. Transabdominal technique was performed for global imaging of the pelvis including uterus, ovaries, adnexal regions, and pelvic cul-de-sac. It was necessary to proceed with endovaginal exam following the transabdominal exam to visualize the right adnexa. COMPARISON:  CT abdomen and pelvis dated June 30, 2021 FINDINGS: Uterus Measurements: 8.4 x 3.5 x 3.6 cm = volume: 55.6 mL. Anteverted. No fibroids or other mass visualized. Endometrium Thickness: 2.3 mm.  No focal abnormality visualized. Right ovary Measurements: 3.1 x 2.3 x 2.5 cm = volume: 9.5 mL. Several small follicles are seen. No fibroids or other mass visualized. Left ovary Not visualized. Other findings Large volume pelvic ascites. IMPRESSION: 1. No evidence of adnexal mass. 2. Large volume pelvic ascites. Electronically Signed   By: Yetta Glassman M.D.   On: 07/01/2021 09:48  ? ?CT ABDOMEN PELVIS W CONTRAST ? ?  Addendum  Date: 07/01/2021   ?ADDENDUM REPORT: 07/01/2021 20:18 ADDENDUM: Asymmetric partially visualized left breast soft tissues with query underlying necrotic or infectious lesion. Finding may be related to a pr

## 2021-07-02 NOTE — Hospital Course (Addendum)
36 y.o. female with medical history significant of with history of tobacco use disorder, and marijuana use, presents to the ED with a chief complaint of abdominal swelling.  Patient reports that she had sudden onset of abdominal swelling in April 10.  She thinks she had about 10 pound weight gain.  She does have pain that is in the left lower quadrant that feels crampy and is intermittent.  Patient reports that today she started having diarrhea and had at least 5 episodes.  She denies hematochezia or melena.  She has no known liver problems.  She has no family history of liver disease.  Patient denies any fevers.  She does report a decrease in her urine output that started a few days ago.  She has had no dysuria or hematuria.  Patient denies peripheral edema.  Patient reports that she drinks about 3 times per year.  She has not had right upper quadrant pain.  She denies any high risk sexual behavior, reporting that she has 1 partner that she has been with for some time.  She does not use IV drugs.  Patient has no other complaints at this time. ?  ?Patient smokes cigarettes about a pack per day.  She does request a nicotine patch.  She is ready to quit.  She reports she drinks alcohol about 3 times per year.  She uses marijuana and the last use was 5 PM on the day of presentation.  She is vaccinated for COVID.  Patient is full code. ?  ?I have requested GI consultation and spent long time discussing what we know so far with patient who does not seem to grasp, father and call to brother.  I ordered US paracentesis with cytology and other testing as part of work up.  GI team ordered a lot of tests as part of work up.  We don't have answers right now, we are treating supportively and need to wait for more data to come in.  Pt's liver exam is very impressive.  It is extremely enlarged and very hard to the touch.  Pt also has a large left breast mass.   ? ?07/02/2021: Pt had left breast core biospy done at [facility].  Pt  scheduled to have liver biopsy at [facility] IR on 4/19.  Oncologist Dr. Delton Coombes consulted.   ?

## 2021-07-02 NOTE — Progress Notes (Signed)
Planning for liver biopsy at [facility] IR tomorrow.  Spoke with RN regarding transport to have pt here at 0830 on 07/03/21.  Will see and consent upon arrival. ? ?Electronically Signed: ?Pasty Spillers, PA-C ?07/02/2021, 2:47 PM ? ? ?

## 2021-07-03 ENCOUNTER — Inpatient Hospital Stay (HOSPITAL_COMMUNITY): Payer: Medicaid Other

## 2021-07-03 ENCOUNTER — Encounter (HOSPITAL_COMMUNITY): Payer: Self-pay | Admitting: Radiology

## 2021-07-03 ENCOUNTER — Inpatient Hospital Stay (HOSPITAL_COMMUNITY)
Admit: 2021-07-03 | Discharge: 2021-07-03 | Disposition: A | Discharge status: Other Acute Inpatient Hospital | Payer: Medicaid Other | Source: Home / Self Care | Attending: Family Medicine | Admitting: Family Medicine

## 2021-07-03 DIAGNOSIS — K449 Diaphragmatic hernia without obstruction or gangrene: Secondary | ICD-10-CM | POA: Insufficient documentation

## 2021-07-03 DIAGNOSIS — R188 Other ascites: Secondary | ICD-10-CM | POA: Insufficient documentation

## 2021-07-03 DIAGNOSIS — C50919 Malignant neoplasm of unspecified site of unspecified female breast: Secondary | ICD-10-CM | POA: Insufficient documentation

## 2021-07-03 DIAGNOSIS — N9489 Other specified conditions associated with female genital organs and menstrual cycle: Secondary | ICD-10-CM | POA: Diagnosis not present

## 2021-07-03 DIAGNOSIS — C787 Secondary malignant neoplasm of liver and intrahepatic bile duct: Secondary | ICD-10-CM | POA: Insufficient documentation

## 2021-07-03 DIAGNOSIS — Z79899 Other long term (current) drug therapy: Secondary | ICD-10-CM | POA: Insufficient documentation

## 2021-07-03 DIAGNOSIS — F17210 Nicotine dependence, cigarettes, uncomplicated: Secondary | ICD-10-CM | POA: Insufficient documentation

## 2021-07-03 DIAGNOSIS — F1290 Cannabis use, unspecified, uncomplicated: Secondary | ICD-10-CM | POA: Insufficient documentation

## 2021-07-03 DIAGNOSIS — R932 Abnormal findings on diagnostic imaging of liver and biliary tract: Secondary | ICD-10-CM

## 2021-07-03 DIAGNOSIS — R140 Abdominal distension (gaseous): Secondary | ICD-10-CM | POA: Diagnosis not present

## 2021-07-03 DIAGNOSIS — K7200 Acute and subacute hepatic failure without coma: Secondary | ICD-10-CM | POA: Diagnosis not present

## 2021-07-03 DIAGNOSIS — R160 Hepatomegaly, not elsewhere classified: Secondary | ICD-10-CM | POA: Diagnosis not present

## 2021-07-03 DIAGNOSIS — F17290 Nicotine dependence, other tobacco product, uncomplicated: Secondary | ICD-10-CM | POA: Insufficient documentation

## 2021-07-03 LAB — GLUCOSE, CAPILLARY
Glucose-Capillary: 106 mg/dL — ABNORMAL HIGH (ref 70–99)
Glucose-Capillary: 35 mg/dL — CL (ref 70–99)
Glucose-Capillary: 49 mg/dL — ABNORMAL LOW (ref 70–99)
Glucose-Capillary: 78 mg/dL (ref 70–99)
Glucose-Capillary: 80 mg/dL (ref 70–99)
Glucose-Capillary: 83 mg/dL (ref 70–99)

## 2021-07-03 LAB — COMPREHENSIVE METABOLIC PANEL
ALT: 115 U/L — ABNORMAL HIGH (ref 0–44)
AST: 668 U/L — ABNORMAL HIGH (ref 15–41)
Albumin: 1.7 g/dL — ABNORMAL LOW (ref 3.5–5.0)
Alkaline Phosphatase: 203 U/L — ABNORMAL HIGH (ref 38–126)
Anion gap: 12 (ref 5–15)
BUN: 24 mg/dL — ABNORMAL HIGH (ref 6–20)
CO2: 20 mmol/L — ABNORMAL LOW (ref 22–32)
Calcium: 8.1 mg/dL — ABNORMAL LOW (ref 8.9–10.3)
Chloride: 97 mmol/L — ABNORMAL LOW (ref 98–111)
Creatinine, Ser: 0.74 mg/dL (ref 0.44–1.00)
GFR, Estimated: >60 mL/min (ref >60)
Glucose, Bld: 67 mg/dL — ABNORMAL LOW (ref 70–99)
Potassium: 4.6 mmol/L (ref 3.5–5.1)
Sodium: 129 mmol/L — ABNORMAL LOW (ref 135–145)
Total Bilirubin: 6.6 mg/dL — ABNORMAL HIGH (ref 0.3–1.2)
Total Protein: 5.7 g/dL — ABNORMAL LOW (ref 6.5–8.1)

## 2021-07-03 LAB — PROTIME-INR
INR: 2.1 — ABNORMAL HIGH (ref 0.8–1.2)
Prothrombin Time: 23.1 seconds — ABNORMAL HIGH (ref 11.4–15.2)

## 2021-07-03 LAB — CBC WITH DIFFERENTIAL/PLATELET
Basophils Absolute: 0 10*3/uL (ref 0.0–0.1)
Basophils Relative: 0 %
Eosinophils Absolute: 0 10*3/uL (ref 0.0–0.5)
Eosinophils Relative: 0 %
HCT: 31.5 % — ABNORMAL LOW (ref 36.0–46.0)
Hemoglobin: 10.6 g/dL — ABNORMAL LOW (ref 12.0–15.0)
Lymphocytes Relative: 13 %
Lymphs Abs: 2 10*3/uL (ref 0.7–4.0)
MCH: 30.3 pg (ref 26.0–34.0)
MCHC: 33.7 g/dL (ref 30.0–36.0)
MCV: 90 fL (ref 80.0–100.0)
Metamyelocytes Relative: 1 %
Monocytes Absolute: 2.1 10*3/uL — ABNORMAL HIGH (ref 0.1–1.0)
Monocytes Relative: 14 %
Myelocytes: 2 %
Neutro Abs: 10.6 10*3/uL — ABNORMAL HIGH (ref 1.7–7.7)
Neutrophils Relative %: 70 %
Platelets: 125 10*3/uL — ABNORMAL LOW (ref 150–400)
RBC: 3.5 MIL/uL — ABNORMAL LOW (ref 3.87–5.11)
RDW: 21.9 % — ABNORMAL HIGH (ref 11.5–15.5)
WBC: 15.2 10*3/uL — ABNORMAL HIGH (ref 4.0–10.5)
nRBC: 3.4 % — ABNORMAL HIGH (ref 0.0–0.2)
nRBC: 4 /100 WBC — ABNORMAL HIGH

## 2021-07-03 LAB — CYTOLOGY - NON PAP

## 2021-07-03 LAB — BILIRUBIN, DIRECT: Bilirubin, Direct: 4.2 mg/dL — ABNORMAL HIGH (ref 0.0–0.2)

## 2021-07-03 MED ORDER — IOHEXOL 300 MG/ML  SOLN
100.0000 mL | Freq: Once | INTRAMUSCULAR | Status: AC | PRN
  Administered 2021-07-03: 75 mL via INTRAVENOUS

## 2021-07-03 MED ORDER — MIDAZOLAM HCL 2 MG/2ML IJ SOLN
INTRAMUSCULAR | Status: AC
  Filled 2021-07-03: qty 2

## 2021-07-03 MED ORDER — DEXTROSE 50 % IV SOLN
25.0000 g | Freq: Once | INTRAVENOUS | Status: AC
  Administered 2021-07-03: 25 g via INTRAVENOUS
  Filled 2021-07-03: qty 50

## 2021-07-03 MED ORDER — HEPARIN SODIUM (PORCINE) 5000 UNIT/ML IJ SOLN
5000.0000 [IU] | Freq: Three times a day (TID) | INTRAMUSCULAR | Status: DC
  Administered 2021-07-04 – 2021-07-05 (×3): 5000 [IU] via SUBCUTANEOUS
  Filled 2021-07-03 (×3): qty 1

## 2021-07-03 MED ORDER — PROCHLORPERAZINE 25 MG RE SUPP
25.0000 mg | Freq: Two times a day (BID) | RECTAL | Status: DC | PRN
  Administered 2021-07-03 – 2021-07-04 (×2): 25 mg via RECTAL
  Filled 2021-07-03 (×3): qty 1

## 2021-07-03 MED ORDER — ONDANSETRON HCL 4 MG/2ML IJ SOLN
4.0000 mg | Freq: Once | INTRAMUSCULAR | Status: AC
Start: 2021-07-03 — End: 2021-07-03
  Administered 2021-07-03: 4 mg via INTRAVENOUS
  Filled 2021-07-03: qty 2

## 2021-07-03 MED ORDER — SODIUM CHLORIDE 0.9 % IV SOLN
3.0000 g | Freq: Four times a day (QID) | INTRAVENOUS | Status: DC
  Administered 2021-07-03 – 2021-07-06 (×9): 3 g via INTRAVENOUS
  Filled 2021-07-03 (×4): qty 8
  Filled 2021-07-03: qty 3
  Filled 2021-07-03 (×12): qty 8

## 2021-07-03 MED ORDER — SODIUM CHLORIDE 0.9 % IV SOLN
12.5000 mg | Freq: Once | INTRAVENOUS | Status: AC
  Administered 2021-07-03: 12.5 mg via INTRAVENOUS
  Filled 2021-07-03: qty 0.5

## 2021-07-03 MED ORDER — PROMETHAZINE HCL 25 MG/ML IJ SOLN
INTRAMUSCULAR | Status: AC
Start: 2021-07-03 — End: ?
  Filled 2021-07-03: qty 1

## 2021-07-03 MED ORDER — FENTANYL CITRATE (PF) 100 MCG/2ML IJ SOLN
INTRAMUSCULAR | Status: AC
  Filled 2021-07-03: qty 2

## 2021-07-03 MED ORDER — DEXTROSE-NACL 5-0.9 % IV SOLN
INTRAVENOUS | Status: AC
Start: 2021-07-03 — End: 2021-07-03

## 2021-07-03 MED ORDER — VITAMIN K1 10 MG/ML IJ SOLN
5.0000 mg | Freq: Once | INTRAVENOUS | Status: AC
  Administered 2021-07-03: 5 mg via INTRAVENOUS
  Filled 2021-07-03: qty 0.5

## 2021-07-03 MED ORDER — FENTANYL CITRATE (PF) 100 MCG/2ML IJ SOLN
INTRAMUSCULAR | Status: AC | PRN
  Administered 2021-07-03: 25 ug via INTRAVENOUS

## 2021-07-03 MED ORDER — LIDOCAINE HCL (PF) 1 % IJ SOLN
INTRAMUSCULAR | Status: AC
  Filled 2021-07-03: qty 30

## 2021-07-03 NOTE — Progress Notes (Signed)
Patient Blood glucose 49 mg/dl. Per MD Courage give D50 1 amp and start D5NS before patient transfers to [facility] IR for procedure. Patient reports complaints of nausea given PRN, see MAR. [facility] aware of patients blood glucose. Patient transported to [facility] via [place].  Re-checked patients Blood glucose at 1026 when patient returned blood glucose was 106 mg/dL. ?

## 2021-07-03 NOTE — Consult Note (Signed)
? ? ? ?Chief Complaint: ?Liver lesion ? ?Referring Physician(s): ?Clanford Bottoms ? ?Supervising Physician: Arne Cleveland ? ?Patient Status: [facility] - In-pt ? ?History of Present Illness: ?Jennifer Pollard is a 36 y.o. female with history of tobacco and marijuana use who presented to the ED complaining of abdominal distension. ? ?Per chart, she has had left lower quadrant intermittent cramping and diarrhea. ? ?Workup included CT abd/pelvis which showed = ?1. Cirrhotic morphology of the liver with venous collaterals ?suggestive of portal hypertension. Innumerable hepatic lesions ?concerning for metastatic malignancy. Hepatomegaly. Recommend MRI ?liver protocol for further evaluation. When the patient is ?clinically stable and able to follow directions and hold their ?breath (preferably as an outpatient) further evaluation with ?dedicated abdominal MRI should be considered. ?2. Moderate volume simple fluid ascites. ?3. Query soft tissue density 2.5 x 2.2 cm right adnexal lesion. ?Recommend pelvic ultrasound for further evaluation. ? ?MRI showed= ?1. Abnormal asymmetric poorly defined masslike hyperenhancement ?throughout the left breast parenchyma, highly suspicious for left ?breast primary malignancy. Diagnostic mammographic evaluation ?recommended when clinically feasible. ?2. Hepatomegaly. Innumerable confluent hypoenhancing liver masses ?throughout the liver, which distort the liver capsule. These ?findings are most compatible with bulky liver metastases. ?Percutaneous liver mass biopsy suggested for diagnosis. ?3. Small volume ascites. ?4. Mild diffuse gallbladder wall thickening, nonspecific, probably ?due to noninflammatory edema. No cholelithiasis. No biliary ductal ?dilatation. ?5. Mild wall thickening throughout the visualized right colon, ?nonspecific, favor noninflammatory edema. ?6. Small hiatal hernia. ?   ?She underwent a left breast core biospy at [facility] yesterday. ? ?She is here today for a liver  biopsy. ? ?Oncologist Dr. Delton Coombes has been consulted.   ? ?She is NPO.  ? ?Past Medical History:  ?Diagnosis Date  ? Medical history non-contributory   ? ? ?Past Surgical History:  ?Procedure Laterality Date  ? None    ? ? ?Allergies: ?Patient has no known allergies. ? ?Medications: ?Prior to Admission medications   ?Medication Sig Start Date End Date Taking? Authorizing Provider  ?acetaminophen (TYLENOL) 500 MG tablet Take 1,000 mg by mouth every 6 (six) hours as needed for moderate pain.    [provider]  ?cyclobenzaprine (FLEXERIL) 10 MG tablet Take 1 tablet (10 mg total) by mouth 3 (three) times daily as needed. ?Patient not taking: Reported on 07/01/2021 01/16/18   Kem Parkinson, PA-C  ?diclofenac (VOLTAREN) 50 MG EC tablet Take 1 tablet (50 mg total) by mouth 2 (two) times daily. ?Patient not taking: Reported on 07/01/2021 07/25/15   Fransico Meadow, PA-C  ?ibuprofen (ADVIL) 800 MG tablet Take 1 tablet (800 mg total) by mouth 3 (three) times daily. ?Patient not taking: Reported on 07/01/2021 10/27/19   Garald Balding, PA-C  ?predniSONE (STERAPRED UNI-PAK 21 TAB) 10 MG (21) TBPK tablet '10mg'$  Tabs, 6 day taper. Use as directed ?Patient not taking: Reported on 07/01/2021 05/27/21   Truddie Hidden, MD  ?traMADol (ULTRAM) 50 MG tablet 1 or 2 po q6h prn pain ?Patient not taking: Reported on 07/01/2021 08/29/14   Lily Kocher, PA-C  ?  ? ?Family History  ?Problem Relation Age of Onset  ? Colon cancer Neg Hx   ? Colon polyps Neg Hx   ? Liver disease Neg Hx   ? ? ?Social History  ? ?Socioeconomic History  ? Marital status: Single  ?  Spouse name: Not on file  ? Number of children: 1  ? Years of education: Not on file  ? Highest education level: Not on file  ?  Occupational History  ? Not on file  ?Tobacco Use  ? Smoking status: Every Day  ?  Packs/day: 1.00  ?  Types: Cigars, Cigarettes  ? Smokeless tobacco: Never  ?Vaping Use  ? Vaping Use: Never used  ?Substance and Sexual Activity  ? Alcohol use: No  ?   Comment: denies currently. Unable to quantify, stating she drank as a teenager. Denies any recent alcohol intake as of 4/17  ? Drug use: Yes  ?  Types: Marijuana  ? Sexual activity: Yes  ?  Birth control/protection: Injection  ?Other Topics Concern  ? Not on file  ?Social History Narrative  ? Not on file  ? ?Social Determinants of Health  ? ?Financial Resource Strain: Not on file  ?Food Insecurity: Not on file  ?Transportation Needs: Not on file  ?Physical Activity: Not on file  ?Stress: Not on file  ?Social Connections: Not on file  ? ? ? ?Review of Systems: A 12 point ROS discussed and pertinent positives are indicated in the HPI above.  All other systems are negative. ? ?Review of Systems  ?Constitutional:  Positive for activity change, appetite change, fatigue and unexpected weight change.  ?Gastrointestinal:  Positive for abdominal distention and abdominal pain.  ?Genitourinary:  Positive for decreased urine volume.  ?Musculoskeletal: Negative.   ?Skin:  Positive for color change.  ?Neurological: Negative.   ?Hematological: Negative.   ?Psychiatric/Behavioral: Negative.    ? ?Vital Signs: ?BP 106/76 (BP Location: Left Arm)   Pulse (!) 106   Resp 20   LMP 06/10/2021   SpO2 92%  ? ?Physical Exam ?Constitutional:   ?   Appearance: She is ill-appearing.  ?HENT:  ?   Head: Normocephalic and atraumatic.  ?Eyes:  ?   General: Scleral icterus present.  ?Cardiovascular:  ?   Rate and Rhythm: Normal rate and regular rhythm.  ?Pulmonary:  ?   Effort: Pulmonary effort is normal. No respiratory distress.  ?   Breath sounds: Normal breath sounds.  ?Abdominal:  ?   General: There is distension.  ?   Tenderness: There is abdominal tenderness.  ?Musculoskeletal:     ?   General: Normal range of motion.  ?Skin: ?   General: Skin is warm and dry.  ?Neurological:  ?   General: No focal deficit present.  ?   Mental Status: She is alert and oriented to person, place, and time.  ?Psychiatric:     ?   Mood and Affect: Mood normal.      ?   Behavior: Behavior normal.     ?   Thought Content: Thought content normal.     ?   Judgment: Judgment normal.  ? ? ?Imaging: ?MR ABDOMEN W WO CONTRAST ? ?Result Date: 07/01/2021 ?CLINICAL DATA:  Inpatient. Ascites. Elevated liver function tests. Liver masses. EXAM: MRI ABDOMEN WITHOUT AND WITH CONTRAST TECHNIQUE: Multiplanar multisequence MR imaging of the abdomen was performed both before and after the administration of intravenous contrast. CONTRAST:  23m GADAVIST GADOBUTROL 1 MMOL/ML IV SOLN COMPARISON:  06/22/2021 CT abdomen/pelvis. FINDINGS: Substantially motion degraded study, significantly limiting assessment. Lower chest: No acute abnormality at the lung bases. Hepatobiliary: Hepatomegaly. Liver parenchyma is nearly entirely replaced by innumerable confluent bulky hypoenhancing liver masses throughout the liver, which distort the liver capsule. Representative liver masses as follows: -posterosuperior right liver 12.5 x 12.0 cm mass (series 14/image 42), likely representing multiple confluent masses -segment 8 right liver 4.0 x 2.6 cm mass (series 14/image 50) -segment 2 left  liver 4.4 x 4.1 cm mass (series 14/image 50) -segment 4B left liver 6.2 x 5.4 cm mass (series 14/image 69) No discrete cholelithiasis. No gallbladder distention. Mild diffuse gallbladder wall thickening. No biliary ductal dilatation. Common bile duct diameter 2 mm. No evidence of choledocholithiasis. Pancreas: No pancreatic mass or duct dilation. Spleen: Normal size. No mass. Adrenals/Urinary Tract: Normal adrenals. No hydronephrosis. Normal kidneys with no renal mass. Stomach/Bowel: Small hiatal hernia. Otherwise normal nondistended stomach. Normal caliber small and large bowel. Mild wall thickening throughout the visualized right colon. No definite small bowel wall thickening. Vascular/Lymphatic: Normal caliber abdominal aorta. Patent renal, portal and splenic veins. Extrinsic narrowing of the hepatic veins, which appear  patent. No pathologically enlarged lymph nodes in the abdomen. Other: Small volume ascites. No focal fluid collection. Abnormal asymmetric poorly defined masslike hyperenhancement throughout the left breast parenc

## 2021-07-03 NOTE — Progress Notes (Signed)
Pharmacy Antibiotic Note ? ?Jennifer Pollard is a 36 y.o. female admitted on 07/08/2021 with  colitis .  Pharmacy has been consulted for Unasyn dosing. ? ?Pt with poor prognosis due to liver failure. He was on cipro/flagyl for colitis but couldn't tolerate due to emesis. We will use Unasyn instead.  ? ?Scr <1 ?Plan: ?Unasyn 3g IV q8 ? ?Height: '5\' 4"'$  (162.6 cm) ?Weight: 77.4 kg (170 lb 10.2 oz) ?IBW/kg (Calculated) : 54.7 ? ?Temp (24hrs), Avg:98.1 ?F (36.7 ?C), Min:97.4 ?F (36.3 ?C), Max:99.1 ?F (37.3 ?C) ? ?Recent Labs  ?Lab 06/29/2021 ?2115 07/01/21 ?0015 07/01/21 ?0326 07/02/21 ?0507 07/03/21 ?7169  ?WBC 12.0*  --  12.1* 13.6* 15.2*  ?CREATININE 0.83  --  0.85 0.83 0.74  ?LATICACIDVEN  --  7.9* 8.1*  --   --   ?  ?Estimated Creatinine Clearance: 97 mL/min (by C-G formula based on SCr of 0.74 mg/dL).   ? ?No Known Allergies ? ?Antimicrobials this admission: ?4/18 cipro>>4/19 ?4/18 flagyl>>4/19 ? ?Dose adjustments this admission: ? ? ?Microbiology results: ?4/17 peritoneal>>ngtd ?4/17 GI panel>>neg ? ?Onnie Boer, PharmD, BCIDP, AAHIVP, CPP ?Infectious Disease Pharmacist ?07/03/2021 7:24 PM ? ? ?

## 2021-07-03 NOTE — Procedures (Signed)
?  Procedure:  1. US Paracentesis 1800 ml ?2. [place] core liver lesion biopsy 18g ?Preprocedure diagnosis: Breast cancer, liver lesions ? ?Postprocedure diagnosis: same ?EBL:    minimal ?Complications:   none immediate ? ?See full dictation in [facility]. ? ?D. Arne Cleveland MD ?Main # [phone] ?Pager  [phone] ?Mobile [phone] ?  ? ?

## 2021-07-03 NOTE — Progress Notes (Signed)
?PROGRESS NOTE ? ? ?Jennifer Pollard  QQI:[id number] DOB: [birth year] DOA: 06/17/2021 ?PCP: Jennifer Spar, MD  ? ?Chief Complaint  ?Patient presents with  ? Abdominal Pain  ? ?Level of care: Telemetry ? ?Brief Admission History:  ?36 y.o. female with medical history significant of with history of tobacco use disorder, and marijuana use, presents to the ED with a chief complaint of abdominal swelling.  Patient reports that she had sudden onset of abdominal swelling in April 10.  She thinks she had about 10 pound weight gain.  She does have pain that is in the left lower quadrant that feels crampy and is intermittent.  Patient reports that today she started having diarrhea and had at least 5 episodes.  She denies hematochezia or melena.  She has no known liver problems.  She has no family history of liver disease.  Patient denies any fevers.  She does report a decrease in her urine output that started a few days ago.  She has had no dysuria or hematuria.  Patient denies peripheral edema.  Patient reports that she drinks about 3 times per year.  She has not had right upper quadrant pain.  She denies any high risk sexual behavior, reporting that she has 1 partner that she has been with for some time.  She does not use IV drugs.  Patient has no other complaints at this time. ?  ?Patient smokes cigarettes about a pack per day.  She does request a nicotine patch.  She is ready to quit.  She reports she drinks alcohol about 3 times per year.  She uses marijuana and the last use was 5 PM on the day of presentation.  She is vaccinated for COVID.  Patient is full code. ?  ?I have requested GI consultation and spent long time discussing what we know so far with patient who does not seem to grasp, father and call to brother.  I ordered US paracentesis with cytology and other testing as part of work up.  GI team ordered a lot of tests as part of work up.  We don't have answers right now, we are treating supportively and need to wait  for more data to come in.  Pt's liver exam is very impressive.  It is extremely enlarged and very hard to the touch.  Pt also has a large left breast mass.   ? ?07/02/2021: Pt had left breast core biospy done at [facility].  Pt scheduled to have liver biopsy at [facility] on 4/19.  Oncologist Dr. Delton Coombes consulted.   ?  ?Assessment and Plan: ?1) Acute liver failure--due to heavy tumor/metastatic burden ?- Scleral icterus and ascites on exam ?- Tylenol level less than 10 ?- Alcohol level less than 10 ?- CT scan shows cirrhosis with venous collaterals suggesting portal hypertension.  Innumerable hepatic lesions concerning for metastatic malignancy.  Completed MRI with liver protocol.  Moderate volume simple fluid ascites s/p paracentesis on 07/01/21 with removal of 1.7 L of ascitic fluid negative cytology on ascitic fluid ?-Follow up AFP, CEA, CA 19-9 ?-Acute viral hepatitis profile is negative ?-GI consult appreciated ? ?  Latest Ref Rng & Units 07/03/2021  ?  5:34 AM 07/02/2021  ?  5:07 AM 07/01/2021  ?  3:26 AM  ?Hepatic Function  ?Total Protein 6.5 - 8.1 g/dL 5.7   6.1   6.7    ?Albumin 3.5 - 5.0 g/dL 1.7   1.9   2.1    ?AST 15 - 41  U/L 668   657   675    ?ALT 0 - 44 U/L 115   126   130    ?Alk Phosphatase 38 - 126 U/L 203   215   238    ?Total Bilirubin 0.3 - 1.2 mg/dL 6.6   6.2   5.9    ?Bilirubin, Direct 0.0 - 0.2 mg/dL 4.2      ? 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 Delton Coombes, limited chemo options given elevated bilirubin/LFTs ?-MRI brain with and without contrast and CT chest with contrast for staging pending ?-Pathology from liver biopsy pending done on 07/03/2021 ?-Overall prognosis is poor ? ?3)Severe Lactic acidosis  ?- Blood pressure stable 108/76 ?- Secondary to presumed advanced stage metastatic breast cancer with liver metastases and subsequent cellular and tissue necrosis and presumed infectious colitis  ?-- continue current  supportive measures and antibiotics  ? ?4)Adnexal mass ?- CT scan shows 2.5 x 2.2 cm right adnexal lesion.  ?-pelvic ultrasound was unrevealing, no mass was seen  ?-Pregnancy test negative ? ?5)Colitis ?- Reports 5-6 episodes of diarrhea prior to arrival ?-CT scan shows ascending and transverse colitis ?-Leukocytosis 12.0 ?Treated with Cipro and Flagyl ?-Stop Flagyl due to persistent emesis ?-Okay to treat empirically with Unasyn, can be transitioned to Augmentin when patient is able to tolerate oral intake ? ?6)Tobacco use ?- - nicotine patch ? ?7) hypoglycemic episode--- continue IV dextrose solution until oral intake is more reliable ? ?8)social/ethics--- very poor prognosis overall due to stage IV metastatic left breast cancer with mets to the liver ?-Oncology and palliative care consult requested ?-Plan of care discussed at bedside with patient , her  dad, mom, sister Jennifer Pollard and brother Jennifer Pollard- ? ?DVT prophylaxis: SCDs, restart heparin  ?code Status: Full  ?Family Communication: mother, father, brother updated 4/17, 4/18  ?Disposition: Status is: Inpatient ?Remains inpatient appropriate because: ongoing work up for metastatic breast cancer  ?  ?Consultants:  ?GI ?Oncology  ?IR ?Palliative care ?Procedures:  ?Left breast biopsy 07/02/21 at [facility] ?IR liver biopsy at [facility] on 4/19 ?Antimicrobials:  ?Cipro >>07/03/21 ?Flagyl  >>stopped 07/03/21 ?Start Unasyn 07/03/2021 ? ?Subjective: ?-Patient is somewhat sleepy after liver biopsy/sedation ?Patient with dad, mom, sister Jennifer Pollard and brother Jennifer Pollard at bedside ?-Nausea and vomiting persist ?-Had episode of hypoglycemia while n.p.o. ? ?Objective: ?Vitals:  ? 07/03/21 0700 07/03/21 1244 07/03/21 1300 07/03/21 1815  ?BP:  102/72 105/72 112/78  ?Pulse:  98 (!) 105 (!) 105  ?Resp:  20  20  ?Temp:  98.1 ?F (36.7 ?C)    ?TempSrc:  Oral    ?SpO2:  98% 100% 95%  ?Weight: 77.4 kg     ?Height:      ? ? ?Intake/Output Summary (Last 24 hours) at 07/03/2021 1836 ?Last data filed at  07/03/2021 1519 ?Gross per 24 hour  ?Intake 923.12 ml  ?Output --  ?Net 923.12 ml  ? ?Filed Weights  ? 07/01/21 0400 07/02/21 0510 07/03/21 0700  ?Weight: 76.2 kg 76.2 kg 77.4 kg  ? ?Examination: ? ?Physical Exam ?Gen:-Somewhat lethargic, in no acute distress  ?HEENT:- [place].AT, significant sclera icterus ?Breast--previously known left breast mass ?Neck-Supple Neck,No JVD,.  ?Lungs-  CTAB , fair air movement bilaterally  ?CV- S1, S2 normal, RRR ?Abd-  +ve B.Sounds, Abd Soft, generalized discomfort without rebound or guarding ?Extremity/Skin:- No  edema,   good pedal pulses  ?Psych-affect is flat, oriented x3 ?Neuro-generalized weakness, no new  focal deficits, no tremors ? ? ?Data Reviewed: I have personally reviewed following labs and imaging studies ? ?CBC: ?Recent Labs  ?Lab 06/22/2021 ?2115 07/01/21 ?0326 07/02/21 ?0507 07/03/21 ?5339  ?WBC 12.0* 12.1* 13.6* 15.2*  ?NEUTROABS  --  8.7* 8.3* 10.6*  ?HGB 12.3 12.3 10.6* 10.6*  ?HCT 37.6 37.9 31.7* 31.5*  ?MCV 91.7 91.8 90.8 90.0  ?PLT 157 158 134* 125*  ? ? ?Basic Metabolic Panel: ?Recent Labs  ?Lab 06/25/2021 ?2115 07/01/21 ?0326 07/02/21 ?0507 07/03/21 ?1792  ?NA 134* 133* 129* 129*  ?K 4.5 3.8 4.1 4.6  ?CL 99 98 97* 97*  ?CO2 21* 21* 19* 20*  ?GLUCOSE 73 95 95 67*  ?BUN 18 18 22* 24*  ?CREATININE 0.83 0.85 0.83 0.74  ?CALCIUM 8.8* 8.5* 8.2* 8.1*  ?MG  --  2.3  --   --   ? ? ?CBG: ?Recent Labs  ?Lab 07/03/21 ?0805 07/03/21 ?1026 07/03/21 ?1243 07/03/21 ?1801  ?GLUCAP 49* 106* 78 83  ? ? ?Recent Results (from the past 240 hour(s))  ?Resp Panel by RT-PCR (Flu A&B, Covid) Nasopharyngeal Swab     Status: None  ? Collection Time: 06/15/2021 11:54 PM  ? Specimen: Nasopharyngeal Swab; Nasopharyngeal(NP) swabs in vial transport medium  ?Result Value Ref Range Status  ? SARS Coronavirus 2 by RT PCR NEGATIVE NEGATIVE Final  ?  Comment: (NOTE) ?SARS-CoV-2 target nucleic acids are NOT DETECTED. ? ?The SARS-CoV-2 RNA is generally detectable in upper respiratory ?specimens during the  acute phase of infection. The lowest ?concentration of SARS-CoV-2 viral copies this assay can detect is ?138 copies/mL. A negative result does not preclude SARS-Cov-2 ?infection and should not be used as the sole

## 2021-07-03 NOTE — Progress Notes (Signed)
Patient had several emesis this shift, clear liquids. Patient reported each time she attempts to eat or drink she vomits afterwards. MD Courage made aware. New orders placed. ?

## 2021-07-03 NOTE — Progress Notes (Signed)
Patient had two emesis this shift, clear in color. Patient reports no complaints of pain/discomfort. MD Courage made aware. New orders placed.  ?

## 2021-07-03 NOTE — Progress Notes (Signed)
?Subjective: ?Patient reports continued nausea with 2 episodes of vomiting, no BMs today. Patient's mother at bedside. Patient lying under the covers upon my assessment, does not provide much communication during visit.  ? ?Objective: ?Vital signs in last 24 hours: ?Temp:  [97.4 ?F (36.3 ?C)-99.1 ?F (37.3 ?C)] 98.2 ?F (36.8 ?C) (04/19 6967) ?Pulse Rate:  [103-108] 106 (04/19 0912) ?Resp:  [17-20] 20 (04/19 0912) ?BP: (91-111)/(55-77) 106/76 (04/19 0912) ?SpO2:  [92 %-95 %] 92 % (04/19 0912) ?Weight:  [77.4 kg] 77.4 kg (04/19 0700) ?Last BM Date : 07/01/21 ?General:   Alert and oriented ?Head:  Normocephalic and atraumatic. ?Eyes:  scleral icterus ?Mouth:  Without lesions, mucosa pink and moist.  ?Heart:  S1, S2 present, no murmurs noted.  ?Lungs: Clear to auscultation bilaterally, without wheezing, rales, or rhonchi.  ?Abdomen:  Bowel sounds present, non tender, soft but full.  No HSM or hernias noted. No rebound or guarding. No masses appreciated  ?Msk:  Symmetrical without gross deformities. Normal posture. ?Pulses:  Normal pulses noted. ?Extremities:  Without clubbing or edema. ?Neurologic:  Alert and  oriented x4;  no asterixis ?Skin:  Warm and dry, intact without significant lesions. ?Psych:  Alert and cooperative. Flat affect ? ?Intake/Output from previous day: ?04/18 0701 - 04/19 0700 ?In: 644.3 [P.O.:200; I.V.:262.8; IV Piggyback:181.5] ?Out: 700 [Urine:700] ? ?Lab Results: ?Recent Labs  ?  07/01/21 ?0326 07/02/21 ?0507 07/03/21 ?8938  ?WBC 12.1* 13.6* 15.2*  ?HGB 12.3 10.6* 10.6*  ?HCT 37.9 31.7* 31.5*  ?PLT 158 134* 125*  ? ?BMET ?Recent Labs  ?  07/01/21 ?0326 07/02/21 ?0507 07/03/21 ?1017  ?NA 133* 129* 129*  ?K 3.8 4.1 4.6  ?CL 98 97* 97*  ?CO2 21* 19* 20*  ?GLUCOSE 95 95 67*  ?BUN 18 22* 24*  ?CREATININE 0.85 0.83 0.74  ?CALCIUM 8.5* 8.2* 8.1*  ? ?LFT ?Recent Labs  ?  07/01/21 ?0326 07/02/21 ?0507 07/03/21 ?5102  ?PROT 6.7 6.1* 5.7*  ?ALBUMIN 2.1* 1.9* 1.7*  ?AST 675* 657* 668*  ?ALT 130* 126* 115*   ?ALKPHOS 238* 215* 203*  ?BILITOT 5.9* 6.2* 6.6*  ?BILIDIR  --   --  4.2*  ? ?PT/INR ?Recent Labs  ?  07/02/21 ?0507 07/03/21 ?5852  ?LABPROT 20.7* 23.1*  ?INR 1.8* 2.1*  ? ?Hepatitis Panel ?Recent Labs  ?  07/01/21 ?0006  ?HEPBSAG NON REACTIVE  ?HCVAB NON REACTIVE  ?HEPAIGM NON REACTIVE  ?HEPBIGM NON REACTIVE  ? ?Studies/Results: ?MR ABDOMEN W WO CONTRAST ? ?Result Date: 07/01/2021 ?CLINICAL DATA:  Inpatient. Ascites. Elevated liver function tests. Liver masses. EXAM: MRI ABDOMEN WITHOUT AND WITH CONTRAST TECHNIQUE: Multiplanar multisequence MR imaging of the abdomen was performed both before and after the administration of intravenous contrast. CONTRAST:  3m GADAVIST GADOBUTROL 1 MMOL/ML IV SOLN COMPARISON:  06/24/2021 CT abdomen/pelvis. FINDINGS: Substantially motion degraded study, significantly limiting assessment. Lower chest: No acute abnormality at the lung bases. Hepatobiliary: Hepatomegaly. Liver parenchyma is nearly entirely replaced by innumerable confluent bulky hypoenhancing liver masses throughout the liver, which distort the liver capsule. Representative liver masses as follows: -posterosuperior right liver 12.5 x 12.0 cm mass (series 14/image 42), likely representing multiple confluent masses -segment 8 right liver 4.0 x 2.6 cm mass (series 14/image 50) -segment 2 left liver 4.4 x 4.1 cm mass (series 14/image 50) -segment 4B left liver 6.2 x 5.4 cm mass (series 14/image 69) No discrete cholelithiasis. No gallbladder distention. Mild diffuse gallbladder wall thickening. No biliary ductal dilatation. Common bile duct diameter 2 mm. No evidence  of choledocholithiasis. Pancreas: No pancreatic mass or duct dilation. Spleen: Normal size. No mass. Adrenals/Urinary Tract: Normal adrenals. No hydronephrosis. Normal kidneys with no renal mass. Stomach/Bowel: Small hiatal hernia. Otherwise normal nondistended stomach. Normal caliber small and large bowel. Mild wall thickening throughout the visualized right  colon. No definite small bowel wall thickening. Vascular/Lymphatic: Normal caliber abdominal aorta. Patent renal, portal and splenic veins. Extrinsic narrowing of the hepatic veins, which appear patent. No pathologically enlarged lymph nodes in the abdomen. Other: Small volume ascites. No focal fluid collection. Abnormal asymmetric poorly defined masslike hyperenhancement throughout the left breast parenchyma (series 14/image 18). Musculoskeletal: No aggressive appearing focal osseous lesions. IMPRESSION: 1. Abnormal asymmetric poorly defined masslike hyperenhancement throughout the left breast parenchyma, highly suspicious for left breast primary malignancy. Diagnostic mammographic evaluation recommended when clinically feasible. 2. Hepatomegaly. Innumerable confluent hypoenhancing liver masses throughout the liver, which distort the liver capsule. These findings are most compatible with bulky liver metastases. Percutaneous liver mass biopsy suggested for diagnosis. 3. Small volume ascites. 4. Mild diffuse gallbladder wall thickening, nonspecific, probably due to noninflammatory edema. No cholelithiasis. No biliary ductal dilatation. 5. Mild wall thickening throughout the visualized right colon, nonspecific, favor noninflammatory edema. 6. Small hiatal hernia. Electronically Signed   By: Ilona Sorrel M.D.   On: 07/01/2021 20:20  ? ?US Abdomen Complete ? ?Result Date: 07/01/2021 ?CLINICAL DATA:  Acute liver failure. EXAM: ABDOMEN ULTRASOUND COMPLETE COMPARISON:  CT abdomen pelvis from yesterday. FINDINGS: Gallbladder: No gallstones. Moderate wall thickening with trace pericholecystic fluid. No sonographic Murphy sign noted by sonographer. Common bile duct: Diameter: 3 mm, normal. Liver: Nodular contour. Markedly heterogeneous parenchymal echogenicity with innumerable hypodense masses. Portal vein is patent on color Doppler imaging with normal direction of blood flow towards the liver. IVC: No abnormality visualized.  Pancreas: Not well visualized due to overlying bowel gas. Spleen: Size and appearance within normal limits. Right Kidney: Length: 12.3 cm. Echogenicity within normal limits. No mass or hydronephrosis visualized. Left Kidney: Length: 11.6 cm. Echogenicity within normal limits. No mass or hydronephrosis visualized. Abdominal aorta: No aneurysm visualized. Other findings: Small upper abdominal ascites. IMPRESSION: 1. Cirrhosis with innumerable hypodense liver masses concerning for metastatic disease. 2. Small upper abdominal ascites. 3. Moderate gallbladder wall thickening without cholelithiasis, likely related to underlying liver disease. Electronically Signed   By: Titus Dubin M.D.   On: 07/01/2021 09:36  ? ?US PELVIS (TRANSABDOMINAL ONLY) ? ?Result Date: 07/01/2021 ?CLINICAL DATA:  Right adnexal mass EXAM: TRANSABDOMINAL AND TRANSVAGINAL ULTRASOUND OF PELVIS TECHNIQUE: Both transabdominal and transvaginal ultrasound examinations of the pelvis were performed. Transabdominal technique was performed for global imaging of the pelvis including uterus, ovaries, adnexal regions, and pelvic cul-de-sac. It was necessary to proceed with endovaginal exam following the transabdominal exam to visualize the right adnexa. COMPARISON:  CT abdomen and pelvis dated June 30, 2021 FINDINGS: Uterus Measurements: 8.4 x 3.5 x 3.6 cm = volume: 55.6 mL. Anteverted. No fibroids or other mass visualized. Endometrium Thickness: 2.3 mm.  No focal abnormality visualized. Right ovary Measurements: 3.1 x 2.3 x 2.5 cm = volume: 9.5 mL. Several small follicles are seen. No fibroids or other mass visualized. Left ovary Not visualized. Other findings Large volume pelvic ascites. IMPRESSION: 1. No evidence of adnexal mass. 2. Large volume pelvic ascites. Electronically Signed   By: Yetta Glassman M.D.   On: 07/01/2021 09:48  ? ?US Paracentesis ? ?Result Date: 07/01/2021 ?INDICATION: Ascites.  Acute liver failure. EXAM: ULTRASOUND GUIDED LLQ  PARACENTESIS MEDICATIONS: 10 mL 1% lidocaine.  COMPLICATIONS: None immediate. PROCEDURE: Informed written consent was obtained from the patient after a discussion of the risks, benefits and alternatives to trea

## 2021-07-04 ENCOUNTER — Telehealth: Payer: Self-pay

## 2021-07-04 ENCOUNTER — Ambulatory Visit: Payer: Medicaid Other | Admitting: Internal Medicine

## 2021-07-04 DIAGNOSIS — K7200 Acute and subacute hepatic failure without coma: Secondary | ICD-10-CM | POA: Diagnosis not present

## 2021-07-04 DIAGNOSIS — C7989 Secondary malignant neoplasm of other specified sites: Secondary | ICD-10-CM

## 2021-07-04 DIAGNOSIS — R140 Abdominal distension (gaseous): Secondary | ICD-10-CM | POA: Diagnosis not present

## 2021-07-04 DIAGNOSIS — Z515 Encounter for palliative care: Secondary | ICD-10-CM | POA: Diagnosis not present

## 2021-07-04 DIAGNOSIS — Z7189 Other specified counseling: Secondary | ICD-10-CM | POA: Diagnosis not present

## 2021-07-04 DIAGNOSIS — R932 Abnormal findings on diagnostic imaging of liver and biliary tract: Secondary | ICD-10-CM | POA: Diagnosis not present

## 2021-07-04 DIAGNOSIS — N9489 Other specified conditions associated with female genital organs and menstrual cycle: Secondary | ICD-10-CM | POA: Diagnosis not present

## 2021-07-04 LAB — CANCER ANTIGEN 15-3: CA 15-3: 1979 U/mL — ABNORMAL HIGH (ref 0.0–25.0)

## 2021-07-04 LAB — CBC WITH DIFFERENTIAL/PLATELET
Basophils Absolute: 0 10*3/uL (ref 0.0–0.1)
Basophils Relative: 0 %
Eosinophils Absolute: 0 10*3/uL (ref 0.0–0.5)
Eosinophils Relative: 0 %
HCT: 33.8 % — ABNORMAL LOW (ref 36.0–46.0)
Hemoglobin: 11.3 g/dL — ABNORMAL LOW (ref 12.0–15.0)
Lymphocytes Relative: 9 %
Lymphs Abs: 1.6 10*3/uL (ref 0.7–4.0)
MCH: 30.5 pg (ref 26.0–34.0)
MCHC: 33.4 g/dL (ref 30.0–36.0)
MCV: 91.4 fL (ref 80.0–100.0)
Monocytes Absolute: 2.7 10*3/uL — ABNORMAL HIGH (ref 0.1–1.0)
Monocytes Relative: 15 %
Myelocytes: 1 %
Neutro Abs: 13.5 10*3/uL — ABNORMAL HIGH (ref 1.7–7.7)
Neutrophils Relative %: 75 %
Platelets: 138 10*3/uL — ABNORMAL LOW (ref 150–400)
RBC: 3.7 MIL/uL — ABNORMAL LOW (ref 3.87–5.11)
RDW: 22.7 % — ABNORMAL HIGH (ref 11.5–15.5)
WBC: 18 10*3/uL — ABNORMAL HIGH (ref 4.0–10.5)
nRBC: 2.9 % — ABNORMAL HIGH (ref 0.0–0.2)
nRBC: 4 /100 WBC — ABNORMAL HIGH

## 2021-07-04 LAB — COMPREHENSIVE METABOLIC PANEL
ALT: 115 U/L — ABNORMAL HIGH (ref 0–44)
AST: 695 U/L — ABNORMAL HIGH (ref 15–41)
Albumin: 1.8 g/dL — ABNORMAL LOW (ref 3.5–5.0)
Alkaline Phosphatase: 204 U/L — ABNORMAL HIGH (ref 38–126)
Anion gap: 14 (ref 5–15)
BUN: 27 mg/dL — ABNORMAL HIGH (ref 6–20)
CO2: 18 mmol/L — ABNORMAL LOW (ref 22–32)
Calcium: 8.2 mg/dL — ABNORMAL LOW (ref 8.9–10.3)
Chloride: 97 mmol/L — ABNORMAL LOW (ref 98–111)
Creatinine, Ser: 0.88 mg/dL (ref 0.44–1.00)
GFR, Estimated: >60 mL/min (ref >60)
Glucose, Bld: 73 mg/dL (ref 70–99)
Potassium: 4.4 mmol/L (ref 3.5–5.1)
Sodium: 129 mmol/L — ABNORMAL LOW (ref 135–145)
Total Bilirubin: 7.4 mg/dL — ABNORMAL HIGH (ref 0.3–1.2)
Total Protein: 6.1 g/dL — ABNORMAL LOW (ref 6.5–8.1)

## 2021-07-04 LAB — CANCER ANTIGEN 27.29: CA 27.29: 1938.1 U/mL — ABNORMAL HIGH (ref 0.0–38.6)

## 2021-07-04 LAB — LIPASE, FLUID: Lipase-Fluid: 53 U/L

## 2021-07-04 LAB — GLUCOSE, CAPILLARY
Glucose-Capillary: 128 mg/dL — ABNORMAL HIGH (ref 70–99)
Glucose-Capillary: 83 mg/dL (ref 70–99)
Glucose-Capillary: 71 mg/dL (ref 70–99)
Glucose-Capillary: 67 mg/dL — ABNORMAL LOW (ref 70–99)
Glucose-Capillary: 72 mg/dL (ref 70–99)
Glucose-Capillary: 69 mg/dL — ABNORMAL LOW (ref 70–99)
Glucose-Capillary: 113 mg/dL — ABNORMAL HIGH (ref 70–99)
Glucose-Capillary: 102 mg/dL — ABNORMAL HIGH (ref 70–99)

## 2021-07-04 LAB — PROTIME-INR
INR: 2.1 — ABNORMAL HIGH (ref 0.8–1.2)
Prothrombin Time: 23.7 seconds — ABNORMAL HIGH (ref 11.4–15.2)

## 2021-07-04 MED ORDER — DEXTROSE-NACL 5-0.9 % IV SOLN: INTRAVENOUS | Status: DC

## 2021-07-04 MED ORDER — VITAMIN K1 10 MG/ML IJ SOLN
5.0000 mg | Freq: Once | INTRAVENOUS | Status: AC
  Administered 2021-07-04: 5 mg via INTRAVENOUS
  Filled 2021-07-04: qty 0.5

## 2021-07-04 MED ORDER — PROMETHAZINE HCL 25 MG/ML IJ SOLN
INTRAMUSCULAR | Status: AC
  Filled 2021-07-04: qty 1

## 2021-07-04 MED ORDER — DEXTROSE 50 % IV SOLN
50.0000 mL | Freq: Once | INTRAVENOUS | Status: AC
Start: 2021-07-04 — End: 2021-07-04
  Administered 2021-07-04: 50 mL via INTRAVENOUS

## 2021-07-04 MED ORDER — SODIUM CHLORIDE 0.9 % IV SOLN
12.5000 mg | Freq: Once | INTRAVENOUS | Status: AC
Start: 2021-07-04 — End: 2021-07-05
  Administered 2021-07-04: 12.5 mg via INTRAVENOUS
  Filled 2021-07-04: qty 0.5

## 2021-07-04 MED ORDER — ENSURE ENLIVE PO LIQD
237.0000 mL | Freq: Three times a day (TID) | ORAL | Status: DC
  Administered 2021-07-04 – 2021-07-05 (×2): 237 mL via ORAL

## 2021-07-04 MED ORDER — DEXTROSE 50 % IV SOLN
INTRAVENOUS | Status: AC
Start: 2021-07-04 — End: 2021-07-04
  Filled 2021-07-04: qty 50

## 2021-07-04 MED ORDER — SODIUM BICARBONATE 650 MG PO TABS
650.0000 mg | ORAL_TABLET | Freq: Two times a day (BID) | ORAL | Status: DC
Start: 2021-07-04 — End: 2021-07-06
  Administered 2021-07-04 – 2021-07-05 (×3): 650 mg via ORAL
  Filled 2021-07-04 (×3): qty 1

## 2021-07-04 NOTE — Progress Notes (Signed)
?   07/04/21 0517  ?Assess: MEWS Score  ?Temp 98 ?F (36.7 ?C)  ?BP 112/74  ?Pulse Rate (!) 111  ?Resp 19  ?SpO2 96 %  ?O2 Device Room Air  ?Assess: MEWS Score  ?MEWS Temp 0  ?MEWS Systolic 0  ?MEWS Pulse 2  ?MEWS RR 0  ?MEWS LOC 0  ?MEWS Score 2  ?MEWS Score Color Yellow  ?Assess: if the MEWS score is Yellow or Red  ?Were vital signs taken at a resting state? Yes  ?Focused Assessment Change from prior assessment (see assessment flowsheet)  ?Early Detection of Sepsis Score *See Row Information* Low  ?MEWS guidelines implemented *See Row Information* Yes  ?Treat  ?MEWS Interventions Other (Comment) ?(notified provider)  ?Pain Scale 0-10  ?Pain Score 0  ?Take Vital Signs  ?Increase Vital Sign Frequency  Yellow: Q 2hr X 2 then Q 4hr X 2, if remains yellow, continue Q 4hrs  ?Escalate  ?MEWS: Escalate Yellow: discuss with charge nurse/RN and consider discussing with provider and RRT  ?Notify: Charge Nurse/RN  ?Name of Charge Nurse/RN Notified Brittiny  ?Date Charge Nurse/RN Notified 07/04/21  ?Time Charge Nurse/RN Notified [phone]  ?Notify: Provider  ?Provider Name/Title Adefeso  ?Date Provider Notified 07/04/21  ?Time Provider Notified [phone]  ?Notification Type  ?(Secure chat)  ?Notification Reason Other (Comment) ?(HR 111, patient removed her tele and refused to put back on)  ? ? ?

## 2021-07-04 NOTE — Consult Note (Signed)
? ?                                                                                ?Consultation Note ?Date: 07/04/2021  ? ?Patient Name: Jennifer Pollard  ?DOB: 24-Jan-1986  MRN: [id number]  Age / Sex: 36 y.o., female  ?PCP: Lindell Spar, MD ?Referring Physician: Roxan Hockey, MD ? ?Reason for Consultation: Establishing goals of care ? ?HPI/Patient Profile: 36 y.o. female  with past medical history of tobacco use disorder admitted on 07/10/2021 with abdominal pain with acute liver failure with newly diagnosed cirrhosis with portal hypertension and concern for metastatic disease. S/P liver biopsy with paracentesis 1868mL completed by IR 4/19. Noted large left breast mass, adnexal lesion, colitis, concern for potential hemochromatosis or possible autoimmune hepatitis. Unfortunately her bilirubin continues to climb.  ? ?Clinical Assessment and Goals of Care: ?I met today at Betania's bedside along with her mother, brother [name], and her father was over speaker phone. I have spoken with Dr. Denton Brick about her condition and plan. Anali was present but did not really participate in conversation. Family have good understanding of the severity of her condition but are also very overwhelmed with this information. I discussed with them the real concern is her failing liver. I worry about how quickly her liver is declining and that even with salvage chemotherapy (which comes with its own risks) may not be enough or may not be able to work quick enough to help the liver. We discussed some signs of liver failure and concern for high ammonia.  ? ?I gently spoke with them about the importance of discussing the more difficult situation. I expressed my concern that at the rate of her decline that I worry we need to discuss expectations of resuscitation measures if her liver continues to fail. I explained that even through we can do CPR, shock, and breathing machines that these measures do nothing to fix the liver and can cause much  pain and suffering. Father was quick to say that they want her resuscitated. I expressed that I understand and respect their wishes but I also think it is important that they continue to talk and pray about these decisions and if this will be best for [facility]. They all express understanding.  ? ?Family express concern for her comfort and nausea. We discussed consideration of scheduled medications and alternating antiemetics. Brother [name] spoke with me outside the room. I verified his concern with him that prognosis is very poor and could be potentially only days unfortunately. I encouraged that anyone who wishes to visit with her should do so soon. We discussed her 60 yo son who he notes is on the spectrum and more like a 24/38 yo. Aeliana and her son already live with her parents so they are continuing to care for him. I explained bereavement and [facility] services through hospice. Unsure about the process of legal guardianship (his father is deceased) but DSS may be able to point them in the right direction.  ? ?All questions/concerns addressed. Emotional support provided.  ? ?Primary Decision Maker ?NEXT OF KIN parents ?  ? ?SUMMARY OF RECOMMENDATIONS   ?- DNR discussion - full code  requested ?- Family are very overwhelmed  ? ?Code Status/Advance Care Planning: ?Full code ? ? ?Symptom Management:  ?Consider scheduled Zofran. Compazine and phenergan for breakthrough nausea.  ? ?Palliative Prophylaxis:  ?Delirium Protocol, Frequent Pain Assessment, Oral Care, and Turn Reposition ? ?Additional Recommendations (Limitations, Scope, Preferences): ?Full Scope Treatment ? ?Psycho-social/Spiritual:  ?Desire for further Chaplaincy support:yes ?Additional Recommendations: Hydrographic surveyor ? ?Prognosis:  ?Prognosis very poor.  ? ?Discharge Planning: To Be Determined  ? ?  ? ?Primary Diagnoses: ?Present on Admission: ? Acute liver failure ? Tobacco use ? Hypoalbuminemia ? Colitis ? Adnexal mass ?  Severe Lactic acidosis  ? Abdominal distension ? Left breast mass ? ? ?I have reviewed the medical record, interviewed the patient and family, and examined the patient. The following aspects are pertinent. ? ?Past Medical History:  ?Diagnosis Date  ? Medical history non-contributory   ? ?Social History  ? ?Socioeconomic History  ? Marital status: Single  ?  Spouse name: Not on file  ? Number of children: 1  ? Years of education: Not on file  ? Highest education level: Not on file  ?Occupational History  ? Not on file  ?Tobacco Use  ? Smoking status: Every Day  ?  Packs/day: 1.00  ?  Types: Cigars, Cigarettes  ? Smokeless tobacco: Never  ?Vaping Use  ? Vaping Use: Never used  ?Substance and Sexual Activity  ? Alcohol use: No  ?  Comment: denies currently. Unable to quantify, stating she drank as a teenager. Denies any recent alcohol intake as of 4/17  ? Drug use: Yes  ?  Types: Marijuana  ? Sexual activity: Yes  ?  Birth control/protection: Injection  ?Other Topics Concern  ? Not on file  ?Social History Narrative  ? Not on file  ? ?Social Determinants of Health  ? ?Financial Resource Strain: Not on file  ?Food Insecurity: Not on file  ?Transportation Needs: Not on file  ?Physical Activity: Not on file  ?Stress: Not on file  ?Social Connections: Not on file  ? ?Family History  ?Problem Relation Age of Onset  ? Colon cancer Neg Hx   ? Colon polyps Neg Hx   ? Liver disease Neg Hx   ? ?Scheduled Meds: ? Chlorhexidine Gluconate Cloth  6 each Topical Q0600  ? heparin injection (subcutaneous)  5,000 Units Subcutaneous Q8H  ? nicotine  21 mg Transdermal Daily  ? pantoprazole (PROTONIX) IV  40 mg Intravenous Q12H  ? sodium bicarbonate  650 mg Oral BID  ? spironolactone  25 mg Oral Daily  ? ?Continuous Infusions: ? ampicillin-sulbactam (UNASYN) IV 3 g (07/04/21 3382)  ? dextrose 5 % and 0.9% NaCl 75 mL/hr at 07/04/21 0840  ? phytonadione (VITAMIN K) IV    ? ?PRN Meds:.alum & mag hydroxide-simeth, LORazepam, ondansetron **OR**  ondansetron (ZOFRAN) IV, oxyCODONE, prochlorperazine ?No Known Allergies ?Review of Systems  ?Unable to perform ROS: Acuity of condition  ? ?Physical Exam ?Vitals and nursing note reviewed.  ?Constitutional:   ?   Appearance: She is ill-appearing.  ?   Comments: Restless  ?Cardiovascular:  ?   Rate and Rhythm: Tachycardia present.  ?Pulmonary:  ?   Effort: No tachypnea, accessory muscle usage or respiratory distress.  ?Neurological:  ?   Comments: Restless. She will answer yes/no questions. Her understanding of her overall condition is questionable.   ? ? ?Vital Signs: BP 111/75 (BP Location: Left Arm)   Pulse (!) 110   Temp 98.1 ?F (36.7 ?  C) (Oral)   Resp 18   Ht $R'5\' 4"'rO$  (1.626 m)   Wt 77.4 kg   LMP 06/10/2021   SpO2 96%   BMI 29.29 kg/m?  ?Pain Scale: 0-10 ?  ?Pain Score: 0-No pain ? ? ?SpO2: SpO2: 96 % ?O2 Device:SpO2: 96 % ?O2 Flow Rate: .  ? ?IO: Intake/output summary:  ?Intake/Output Summary (Last 24 hours) at 07/04/2021 0938 ?Last data filed at 07/04/2021 0900 ?Gross per 24 hour  ?Intake 1503.12 ml  ?Output --  ?Net 1503.12 ml  ? ? ?LBM: Last BM Date : 07/01/21 ?Baseline Weight: Weight: 68 kg ?Most recent weight: Weight: 77.4 kg     ?Palliative Assessment/Data: ? ? ? ? ?Time In: 6701 ? ?Time Total: 60 min ? ?Greater than 50%  of this time was spent counseling and coordinating care related to the above assessment and plan. ? ?Signed by: ?Vinie Sill, NP ?Palliative Medicine Team ?Pager # [phone] (M-F 8a-5p) ?Team Phone # [phone] (Nights/Weekends) ?  ? ? ? ? ? ? ? ? ? ? ? ? ?  ?

## 2021-07-04 NOTE — Progress Notes (Signed)
Hypoglycemic Event ? ?CBG: 67 ? ?Treatment: 4 oz juice/soda ? ?Symptoms: None ? ?Follow-up CBG: Time:1215 CBG Result:72 ? ?Possible Reasons for Event: Inadequate meal intake ? ?Comments/MD notified: Patients Blood glucose was 67 mg/dL. Noted no s/s of hypoglycemia. Patient stated she would drink a soda. Patient given 4 oz soda. Re-checked Blood Glucose 72 mg/dL. MD Courage made aware. ? ? ? ?Jennifer Pollard ? ? ?

## 2021-07-04 NOTE — Progress Notes (Signed)
?PROGRESS NOTE ? ? ?Jennifer Pollard  MLY:[id number] DOB: 04-Sep-1985 DOA: 07/03/2021 ?PCP: Lindell Spar, MD  ? ?Chief Complaint  ?Patient presents with  ? Abdominal Pain  ? ?Level of care: Med-Surg ? ?Brief Admission History:  ?36 y.o. female with medical history significant of with history of tobacco use disorder, and marijuana use, presents to the ED with a chief complaint of abdominal swelling.  Patient reports that she had sudden onset of abdominal swelling in April 10.  She thinks she had about 10 pound weight gain.  She does have pain that is in the left lower quadrant that feels crampy and is intermittent.  Patient reports that today she started having diarrhea and had at least 5 episodes.  She denies hematochezia or melena.  She has no known liver problems.  She has no family history of liver disease.  Patient denies any fevers.  She does report a decrease in her urine output that started a few days ago.  She has had no dysuria or hematuria.  Patient denies peripheral edema.  Patient reports that she drinks about 3 times per year.  She has not had right upper quadrant pain.  She denies any high risk sexual behavior, reporting that she has 1 partner that she has been with for some time.  She does not use IV drugs.  Patient has no other complaints at this time. ?  ?Patient smokes cigarettes about a pack per day.  She does request a nicotine patch.  She is ready to quit.  She reports she drinks alcohol about 3 times per year.  She uses marijuana and the last use was 5 PM on the day of presentation.  She is vaccinated for COVID.  Patient is full code. ?  ?I have requested GI consultation and spent long time discussing what we know so far with patient who does not seem to grasp, father and call to brother.  I ordered US paracentesis with cytology and other testing as part of work up.  GI team ordered a lot of tests as part of work up.  We don't have answers right now, we are treating supportively and need to wait  for more data to come in.  Pt's liver exam is very impressive.  It is extremely enlarged and very hard to the touch.  Pt also has a large left breast mass.   ? ?07/02/2021: Pt had left breast core biospy done at [facility].  Pt scheduled to have liver biopsy at [facility] on 4/19.  Oncologist Dr. Delton Coombes consulted.   ?  ?Assessment and Plan: ?1) Acute liver failure--due to heavy tumor/metastatic burden ?- Scleral icterus and ascites on exam ?- Tylenol level less than 10 ?- Alcohol level less than 10 ?- CT scan shows cirrhosis with venous collaterals suggesting portal hypertension.  Innumerable hepatic lesions concerning for metastatic malignancy.  Completed MRI with liver protocol.  Moderate volume simple fluid ascites s/p paracentesis on 07/01/21 with removal of 1.7 L of ascitic fluid negative cytology on ascitic fluid ?-Follow up AFP, CEA, CA 19-9 ?-Acute viral hepatitis profile is negative ?-Bilirubin trending up ?-GI consult appreciated ? ?  Latest Ref Rng & Units 07/04/2021  ?  5:12 AM 07/03/2021  ?  5:34 AM 07/02/2021  ?  5:07 AM  ?Hepatic Function  ?Total Protein 6.5 - 8.1 g/dL 6.1   5.7   6.1    ?Albumin 3.5 - 5.0 g/dL 1.8   1.7   1.9    ?AST  15 - 41 U/L 695   668   657    ?ALT 0 - 44 U/L 115   115   126    ?Alk Phosphatase 38 - 126 U/L 204   203   215    ?Total Bilirubin 0.3 - 1.2 mg/dL 7.4   6.6   6.2    ?Bilirubin, Direct 0.0 - 0.2 mg/dL  4.2     ? 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 Delton Coombes, limited chemo options given elevated bilirubin/LFTs ?-MRI brain -without evidence of metastasis  ?-CT chest with contrast for staging shows lymphadenopathy ?-Pathology from liver biopsy pending done on 07/03/2021 ?-Overall prognosis is poor ? ?3)Severe Lactic acidosis  ?-- Secondary to presumed advanced stage metastatic breast cancer with liver metastases and subsequent cellular and tissue necrosis and presumed infectious colitis  ?-- continue  current supportive measures and antibiotics  ? ?4)Adnexal mass ?- CT scan shows 2.5 x 2.2 cm right adnexal lesion.  ?-pelvic ultrasound was unrevealing, no mass was seen  ?-Pregnancy test negative ? ?5)Colitis ?- Reports 5-6 episodes of diarrhea prior to arrival ?-CT scan shows ascending and transverse colitis ?-WBC continues to trend up ?Treated with Cipro and Flagyl ?-Stopped Flagyl due to persistent emesis ?--Continue IV Unasyn, can be transitioned to Augmentin when patient is able to tolerate oral intake ? ?6)Tobacco use ?- - nicotine patch ? ?7) hypoglycemic episode--- continues to have episodes of hypoglycemia  ?--continue IV dextrose solution until oral intake is more reliable ? ?8)social/ethics--- very poor prognosis overall due to stage IV metastatic left breast cancer with mets to the liver ?-Oncology and palliative care consult appreciated ?-Plan of care discussed at bedside with patient , her  dad, mom, sister [name] and brother [name]- ? ?DVT prophylaxis: SCDs, restart heparin  ?code Status: Full  ?Family Communication: mother, father, brother updated 4/17, 4/18  ?Disposition: Status is: Inpatient ?Remains inpatient appropriate because: ongoing work up for metastatic breast cancer  ?  ?Consultants:  ?GI ?Oncology  ?IR ?Palliative care ?Procedures:  ?Left breast biopsy 07/02/21 at [facility] ?IR liver biopsy at [facility] on 4/19 ?Antimicrobials:  ?Cipro >>07/03/21 ?Flagyl  >>stopped 07/03/21 ?Start Unasyn 07/03/2021 ? ?Subjective: ?-Episodes of emesis with attempts to eat and drink ?-Had BM, passing gas ?-No fevers ? ?Objective: ?Vitals:  ? 07/04/21 0517 07/04/21 0723 07/04/21 1358 07/04/21 1631  ?BP: 112/74 111/75 106/70   ?Pulse: (!) 111 (!) 110 (!) 44 80  ?Resp: _0 ?Temp: 98 ?F (36.7 ?C) 98.1 ?F (36.7 ?C) 98.3 ?F (36.8 ?C)   ?TempSrc: Oral Oral Oral   ?SpO2: 96% 96% 90% 91%  ?Weight:      ?Height:      ? ? ?Intake/Output Summary (Last 24 hours) at 07/04/2021 1758 ?Last data filed at 07/04/2021 1501 ?Gross  per 24 hour  ?Intake 1277.76 ml  ?Output --  ?Net 1277.76 ml  ? ?Filed Weights  ? 07/01/21 0400 07/02/21 0510 07/03/21 0700  ?Weight: 76.2 kg 76.2 kg 77.4 kg  ? ?Examination: ? ?Physical Exam ?Gen:-More awake, in no acute distress  ?HEENT:- [place].AT, significant sclera icterus ?Breast--previously known left breast mass ?Neck-Supple Neck,No JVD,.  ?Lungs-  CTAB , fair air movement bilaterally  ?CV- S1, S2 normal, RRR ?Abd-  +ve B.Sounds, Abd Soft, generalized discomfort without rebound or guarding ?Extremity/Skin:- No  edema,   good pedal pulses  ?Psych-affect is flat, oriented x3 ?Neuro-generalized weakness, no new focal  deficits, no tremors ? ? ?Data Reviewed: I have personally reviewed following labs and imaging studies ? ?CBC: ?Recent Labs  ?Lab 07/03/2021 ?2115 07/01/21 ?0326 07/02/21 ?0507 07/03/21 ?6815 07/04/21 ?0512  ?WBC 12.0* 12.1* 13.6* 15.2* 18.0*  ?NEUTROABS  --  8.7* 8.3* 10.6* 13.5*  ?HGB 12.3 12.3 10.6* 10.6* 11.3*  ?HCT 37.6 37.9 31.7* 31.5* 33.8*  ?MCV 91.7 91.8 90.8 90.0 91.4  ?PLT 157 158 134* 125* 138*  ? ? ?Basic Metabolic Panel: ?Recent Labs  ?Lab 06/23/2021 ?2115 07/01/21 ?0326 07/02/21 ?0507 07/03/21 ?9470 07/04/21 ?0512  ?NA 134* 133* 129* 129* 129*  ?K 4.5 3.8 4.1 4.6 4.4  ?CL 99 98 97* 97* 97*  ?CO2 21* 21* 19* 20* 18*  ?GLUCOSE 73 95 95 67* 73  ?BUN 18 18 22* 24* 27*  ?CREATININE 0.83 0.85 0.83 0.74 0.88  ?CALCIUM 8.8* 8.5* 8.2* 8.1* 8.2*  ?MG  --  2.3  --   --   --   ? ? ?CBG: ?Recent Labs  ?Lab 07/03/21 ?2353 07/04/21 ?0620 07/04/21 ?0719 07/04/21 ?1124 07/04/21 ?1215  ?GLUCAP 80 83 71 67* 72  ? ? ?Recent Results (from the past 240 hour(s))  ?Resp Panel by RT-PCR (Flu A&B, Covid) Nasopharyngeal Swab     Status: None  ? Collection Time: 07/07/2021 11:54 PM  ? Specimen: Nasopharyngeal Swab; Nasopharyngeal(NP) swabs in vial transport medium  ?Result Value Ref Range Status  ? SARS Coronavirus 2 by RT PCR NEGATIVE NEGATIVE Final  ?  Comment: (NOTE) ?SARS-CoV-2 target nucleic acids are NOT  DETECTED. ? ?The SARS-CoV-2 RNA is generally detectable in upper respiratory ?specimens during the acute phase of infection. The lowest ?concentration of SARS-CoV-2 viral copies this assay can detect is ?138 copi

## 2021-07-04 NOTE — Telephone Encounter (Signed)
Spoke to [place] and will try to find the provider that order. ?

## 2021-07-04 NOTE — Progress Notes (Signed)
Secure chat sent to Dr. Josephine Cables to notify him patient MEWS is yellow 2 due to HR 111. Patient has removed tele and refuses to put back on at this time. Other VS are stable. Will continue to monitor and increase VS check frequency per MEWS protocol.  ?

## 2021-07-04 NOTE — Progress Notes (Signed)
? ?Gastroenterology Progress Note  ? ?Referring Provider: No ref. provider found ?Primary Care Physician:  Lindell Spar, MD ?Primary Gastroenterologist:  Dr. Abbey Chatters ? ?Patient ID: KHALEESI GRUEL; [id number]; 1986/02/27  ? ?Subjective:   ? ?Appetite is poor. No N/V. Drinking Ensures and liquids. +flatus and states she is having regular BMs. Denies abdominal pain.  ? ?Objective:  ? ?Vital signs in last 24 hours: ?Temp:  [98 ?F (36.7 ?C)-98.3 ?F (36.8 ?C)] 98.1 ?F (36.7 ?C) (04/20 0277) ?Pulse Rate:  [98-111] 110 (04/20 0723) ?Resp:  [17-20] 18 (04/20 0723) ?BP: (102-115)/(72-78) 111/75 (04/20 4128) ?SpO2:  [95 %-100 %] 96 % (04/20 0723) ?Last BM Date : 07/01/21 ?General:   Alert,  Well-developed, well-nourished, pleasant and cooperative in NAD. Quiet. Mother and aunt at bedside. ?Head:  Normocephalic and atraumatic. ?Eyes:  Sclera clear, + icterus.  ?Abdomen:  Soft, moderately distended. NT.Normal bowel sounds, without guarding, and without rebound.   ?Extremities:  Without clubbing, deformity or edema. ?Neurologic:  Alert and  oriented x4;  grossly normal neurologically. ?Skin:  Intact without significant lesions or rashes. ?Psych:  Alert and cooperative. Normal mood and affect. ? ?Intake/Output from previous day: ?04/19 0701 - 04/20 0700 ?In: 1263.1 [P.O.:240; I.V.:454.7; IV Piggyback:568.4] ?Out: -  ?Intake/Output this shift: ?Total I/O ?In: 240 [P.O.:240] ?Out: -  ? ?Lab Results: ?CBC ?Recent Labs  ?  07/02/21 ?0507 07/03/21 ?7867 07/04/21 ?0512  ?WBC 13.6* 15.2* 18.0*  ?HGB 10.6* 10.6* 11.3*  ?HCT 31.7* 31.5* 33.8*  ?MCV 90.8 90.0 91.4  ?PLT 134* 125* 138*  ? ?BMET ?Recent Labs  ?  07/02/21 ?0507 07/03/21 ?6720 07/04/21 ?0512  ?NA 129* 129* 129*  ?K 4.1 4.6 4.4  ?CL 97* 97* 97*  ?CO2 19* 20* 18*  ?GLUCOSE 95 67* 73  ?BUN 22* 24* 27*  ?CREATININE 0.83 0.74 0.88  ?CALCIUM 8.2* 8.1* 8.2*  ? ?LFTs ?Recent Labs  ?  07/02/21 ?0507 07/03/21 ?9470 07/04/21 ?0512  ?BILITOT 6.2* 6.6* 7.4*  ?BILIDIR  --  4.2*  --    ?ALKPHOS 215* 203* 204*  ?AST 657* 668* 695*  ?ALT 126* 115* 115*  ?PROT 6.1* 5.7* 6.1*  ?ALBUMIN 1.9* 1.7* 1.8*  ? ?No results for input(s): LIPASE in the last 72 hours. ?PT/INR ?Recent Labs  ?  07/02/21 ?0507 07/03/21 ?9628 07/04/21 ?0512  ?LABPROT 20.7* 23.1* 23.7*  ?INR 1.8* 2.1* 2.1*  ? ?    ?Imaging Studies: ?CT CHEST W CONTRAST ? ?Result Date: 07/03/2021 ?CLINICAL DATA:  Stage IV breast carcinoma EXAM: CT CHEST WITH CONTRAST TECHNIQUE: Multidetector CT imaging of the chest was performed during intravenous contrast administration. RADIATION DOSE REDUCTION: This exam was performed according to the departmental dose-optimization program which includes automated exposure control, adjustment of the mA and/or kV according to patient size and/or use of iterative reconstruction technique. CONTRAST:  65m OMNIPAQUE IOHEXOL 300 MG/ML  SOLN COMPARISON:  None. FINDINGS: Cardiovascular: There is homogeneous enhancement in thoracic aorta. There are no filling defects in the central pulmonary artery branches. There is ectasia of main pulmonary artery measuring 3.5 cm suggesting pulmonary arterial hypertension. Ascending thoracic aorta measures 3.7 cm. Mediastinum/Nodes: There are few enlarged lymph nodes in the mediastinum each measuring less than 10 mm in short axis. There are abnormally enlarged lymph nodes in both axillary regions. Largest of the nodes in the left axilla measures 2 x 1.8 cm. Largest of the nodes in the right axilla measures 1.4 x 1.3 cm. There are few subcentimeter nodes in the supraclavicular region.  Lungs/Pleura: There are patchy infiltrates in both lower lobes, more so on the right side suggesting atelectasis/pneumonia. Small linear densities in the lingula and right middle lobe may suggest subsegmental atelectasis. There is small right pleural effusion. There is minimal left pleural effusion. Upper Abdomen: There are numerous low-attenuation space-occupying lesions of varying sizes scattered  throughout the liver suggesting extensive hepatic metastatic disease. Small hiatal hernia is seen. Musculoskeletal: Patchy mixed density is seen in the sternum. Degenerative changes are noted in the visualized lower cervical spine. There is abnormal skin thickening in both breasts, more so on the left side. Both breasts are dense, more so in the left breast. IMPRESSION: There are numerous low-density space-occupying lesions of varying sizes scattered throughout the liver suggesting extensive hepatic metastatic disease. There is mixed density in the sternum suggesting possible skeletal metastatic disease. There are enlarged lymph nodes in both axillary regions and mediastinum suggesting possible metastatic lymphadenopathy. Largest of the nodes is seen in the left axilla measuring 2 x 1.8 cm. There is skin thickening in both breasts, more so on the left side. Dense breast parenchyma is seen in both breasts. This may be due to primary malignant breast neoplasm. Bilateral pleural effusions, more so on the right side. There are patchy infiltrates in both lower lung fields, more so on the right side suggesting atelectasis/pneumonia. There is ectasia of main pulmonary artery suggesting possible pulmonary arterial hypertension. Other findings as described in the body of the report. Electronically Signed   By: Elmer Picker M.D.   On: 07/03/2021 18:52  ? ?MR BRAIN WO CONTRAST ? ?Result Date: 07/03/2021 ?CLINICAL DATA:  Breast cancer staging EXAM: MRI HEAD WITHOUT CONTRAST TECHNIQUE: Multiplanar, multiecho pulse sequences of the brain and surrounding structures were obtained without intravenous contrast. COMPARISON:  None. FINDINGS: Limited examination. Only sagittal T1, coronal and axial diffusion-weighted imaging and axial T2 weighted sequences were obtained. There is no acute infarct. There is no midline shift or other mass effect. IMPRESSION: Truncated examination. No acute infarct. Electronically Signed   By: Ulyses Jarred M.D.   On: 07/03/2021 19:14  ? ?MR ABDOMEN W WO CONTRAST ? ?Result Date: 07/01/2021 ?CLINICAL DATA:  Inpatient. Ascites. Elevated liver function tests. Liver masses. EXAM: MRI ABDOMEN WITHOUT AND WITH CONTRAST TECHNIQUE: Multiplanar multisequence MR imaging of the abdomen was performed both before and after the administration of intravenous contrast. CONTRAST:  69m GADAVIST GADOBUTROL 1 MMOL/ML IV SOLN COMPARISON:  06/28/2021 CT abdomen/pelvis. FINDINGS: Substantially motion degraded study, significantly limiting assessment. Lower chest: No acute abnormality at the lung bases. Hepatobiliary: Hepatomegaly. Liver parenchyma is nearly entirely replaced by innumerable confluent bulky hypoenhancing liver masses throughout the liver, which distort the liver capsule. Representative liver masses as follows: -posterosuperior right liver 12.5 x 12.0 cm mass (series 14/image 42), likely representing multiple confluent masses -segment 8 right liver 4.0 x 2.6 cm mass (series 14/image 50) -segment 2 left liver 4.4 x 4.1 cm mass (series 14/image 50) -segment 4B left liver 6.2 x 5.4 cm mass (series 14/image 69) No discrete cholelithiasis. No gallbladder distention. Mild diffuse gallbladder wall thickening. No biliary ductal dilatation. Common bile duct diameter 2 mm. No evidence of choledocholithiasis. Pancreas: No pancreatic mass or duct dilation. Spleen: Normal size. No mass. Adrenals/Urinary Tract: Normal adrenals. No hydronephrosis. Normal kidneys with no renal mass. Stomach/Bowel: Small hiatal hernia. Otherwise normal nondistended stomach. Normal caliber small and large bowel. Mild wall thickening throughout the visualized right colon. No definite small bowel wall thickening. Vascular/Lymphatic: Normal caliber abdominal aorta. Patent  renal, portal and splenic veins. Extrinsic narrowing of the hepatic veins, which appear patent. No pathologically enlarged lymph nodes in the abdomen. Other: Small volume ascites. No focal  fluid collection. Abnormal asymmetric poorly defined masslike hyperenhancement throughout the left breast parenchyma (series 14/image 18). Musculoskeletal: No aggressive appearing focal osseous lesions. IMPRESSION:

## 2021-07-04 NOTE — Progress Notes (Signed)
Patient blood sugar reading was 35, patient not symptomatic, A/O x4. Rechecked blood sugar reading 80. Will continue to monitor.  ?

## 2021-07-04 NOTE — Progress Notes (Signed)
Patient had several emesis this shift, clear liquids. Given PRN nausea medication, see MAR. Patient reported after eating and drinking fluids vomiting occurs. MD Courage made aware.  ?

## 2021-07-04 NOTE — Telephone Encounter (Signed)
FYI to give Dr patel information:  Vaughan Basta from [facility] called regarding results on breast biopsy, she is unable to get InTouch with the Dr that ordered it and saw in epic that see Dr Posey Pronto if she could give results to Dr Posey Pronto. ? ?Patient is currently in ICU. ?

## 2021-07-04 NOTE — Progress Notes (Signed)
Patients blood glucose was 69 mg/dL. Noted no s/s of hypoglycemia. Per hypoglycemia protocol patient given 4 oz juice. Patient unable to tolerate juice had emesis. New order placed by MD Courage, give D50 1 amp and increase fluids to 125, see MAR. Re-checked blood glucose was 113 mg/dL. MD Courage made aware. ? ?

## 2021-07-04 NOTE — Telephone Encounter (Signed)
Please let them know to notify the hospitalist that ordered this. We have not seen patient since 2021. She is currently in ICU so hospitalist would need to be notified not patel. ?

## 2021-07-04 NOTE — Progress Notes (Signed)
Patient has had 6-7 brownish/red vomiting episodes since 1900. She is distended and endorses RLQ abdominal pain as well. Blood glucose @ 2145 was 102 with D5% + 0.9 NSL running '125mg'$ /hr continuous. N/v subsided @ 2145 concurrent with Phenergan admin. She is unable to ingest food or drink at this time. Hospitalist notified.  ?

## 2021-07-05 ENCOUNTER — Inpatient Hospital Stay (HOSPITAL_COMMUNITY): Payer: Medicaid Other

## 2021-07-05 DIAGNOSIS — N9489 Other specified conditions associated with female genital organs and menstrual cycle: Secondary | ICD-10-CM | POA: Diagnosis not present

## 2021-07-05 DIAGNOSIS — R932 Abnormal findings on diagnostic imaging of liver and biliary tract: Secondary | ICD-10-CM | POA: Diagnosis not present

## 2021-07-05 DIAGNOSIS — R188 Other ascites: Secondary | ICD-10-CM

## 2021-07-05 DIAGNOSIS — R140 Abdominal distension (gaseous): Secondary | ICD-10-CM | POA: Diagnosis not present

## 2021-07-05 DIAGNOSIS — K7200 Acute and subacute hepatic failure without coma: Secondary | ICD-10-CM | POA: Diagnosis not present

## 2021-07-05 LAB — CBC WITH DIFFERENTIAL/PLATELET
Basophils Absolute: 0 10*3/uL (ref 0.0–0.1)
Basophils Relative: 0 %
Eosinophils Absolute: 0 10*3/uL (ref 0.0–0.5)
Eosinophils Relative: 0 %
HCT: 33.9 % — ABNORMAL LOW (ref 36.0–46.0)
Hemoglobin: 11.3 g/dL — ABNORMAL LOW (ref 12.0–15.0)
Lymphocytes Relative: 7 %
Lymphs Abs: 1.4 10*3/uL (ref 0.7–4.0)
MCH: 30.8 pg (ref 26.0–34.0)
MCHC: 33.3 g/dL (ref 30.0–36.0)
MCV: 92.4 fL (ref 80.0–100.0)
Metamyelocytes Relative: 3 %
Monocytes Absolute: 2.5 10*3/uL — ABNORMAL HIGH (ref 0.1–1.0)
Monocytes Relative: 12 %
Myelocytes: 3 %
Neutro Abs: 15.4 10*3/uL — ABNORMAL HIGH (ref 1.7–7.7)
Neutrophils Relative %: 75 %
Platelets: 119 10*3/uL — ABNORMAL LOW (ref 150–400)
RBC: 3.67 MIL/uL — ABNORMAL LOW (ref 3.87–5.11)
RDW: 23.7 % — ABNORMAL HIGH (ref 11.5–15.5)
WBC: 20.5 10*3/uL — ABNORMAL HIGH (ref 4.0–10.5)
nRBC: 2 /100 WBC — ABNORMAL HIGH
nRBC: 3.4 % — ABNORMAL HIGH (ref 0.0–0.2)

## 2021-07-05 LAB — SURGICAL PATHOLOGY

## 2021-07-05 LAB — COMPREHENSIVE METABOLIC PANEL
ALT: 104 U/L — ABNORMAL HIGH (ref 0–44)
AST: 608 U/L — ABNORMAL HIGH (ref 15–41)
Albumin: 1.7 g/dL — ABNORMAL LOW (ref 3.5–5.0)
Alkaline Phosphatase: 190 U/L — ABNORMAL HIGH (ref 38–126)
Anion gap: 16 — ABNORMAL HIGH (ref 5–15)
BUN: 25 mg/dL — ABNORMAL HIGH (ref 6–20)
CO2: 17 mmol/L — ABNORMAL LOW (ref 22–32)
Calcium: 8.3 mg/dL — ABNORMAL LOW (ref 8.9–10.3)
Chloride: 100 mmol/L (ref 98–111)
Creatinine, Ser: 0.79 mg/dL (ref 0.44–1.00)
GFR, Estimated: >60 mL/min (ref >60)
Glucose, Bld: 80 mg/dL (ref 70–99)
Potassium: 4.4 mmol/L (ref 3.5–5.1)
Sodium: 133 mmol/L — ABNORMAL LOW (ref 135–145)
Total Bilirubin: 8.4 mg/dL — ABNORMAL HIGH (ref 0.3–1.2)
Total Protein: 5.9 g/dL — ABNORMAL LOW (ref 6.5–8.1)

## 2021-07-05 LAB — GLUCOSE, CAPILLARY
Glucose-Capillary: 115 mg/dL — ABNORMAL HIGH (ref 70–99)
Glucose-Capillary: 47 mg/dL — ABNORMAL LOW (ref 70–99)
Glucose-Capillary: 57 mg/dL — ABNORMAL LOW (ref 70–99)
Glucose-Capillary: 66 mg/dL — ABNORMAL LOW (ref 70–99)
Glucose-Capillary: 73 mg/dL (ref 70–99)
Glucose-Capillary: 74 mg/dL (ref 70–99)
Glucose-Capillary: 75 mg/dL (ref 70–99)
Glucose-Capillary: 98 mg/dL (ref 70–99)

## 2021-07-05 LAB — PROTIME-INR
INR: 2.3 — ABNORMAL HIGH (ref 0.8–1.2)
Prothrombin Time: 24.8 seconds — ABNORMAL HIGH (ref 11.4–15.2)

## 2021-07-05 LAB — AMMONIA: Ammonia: 116 umol/L — ABNORMAL HIGH (ref 9–35)

## 2021-07-05 MED ORDER — GLUCOSE-VITAMIN C 4-6 GM-MG PO CHEW
CHEWABLE_TABLET | ORAL | Status: AC
  Administered 2021-07-05: 1
  Filled 2021-07-05: qty 2

## 2021-07-05 MED ORDER — DEXTROSE 50 % IV SOLN
50.0000 mL | Freq: Once | INTRAVENOUS | Status: AC
  Administered 2021-07-05: 50 mL via INTRAVENOUS
  Filled 2021-07-05: qty 50

## 2021-07-05 MED ORDER — PROCHLORPERAZINE 25 MG RE SUPP
25.0000 mg | Freq: Two times a day (BID) | RECTAL | Status: DC | PRN
  Administered 2021-07-06: 25 mg via RECTAL
  Filled 2021-07-05 (×2): qty 1

## 2021-07-05 MED ORDER — PROCHLORPERAZINE EDISYLATE 10 MG/2ML IJ SOLN
10.0000 mg | Freq: Four times a day (QID) | INTRAMUSCULAR | Status: DC | PRN
Start: 2021-07-05 — End: 2021-07-05
  Filled 2021-07-05: qty 2

## 2021-07-05 MED ORDER — ONDANSETRON HCL 4 MG/2ML IJ SOLN
4.0000 mg | Freq: Four times a day (QID) | INTRAMUSCULAR | Status: DC
  Administered 2021-07-05 – 2021-07-06 (×3): 4 mg via INTRAVENOUS
  Filled 2021-07-05 (×3): qty 2

## 2021-07-05 MED ORDER — FENTANYL CITRATE PF 50 MCG/ML IJ SOSY
25.0000 ug | PREFILLED_SYRINGE | INTRAMUSCULAR | Status: DC | PRN
  Administered 2021-07-05 – 2021-07-06 (×4): 25 ug via INTRAVENOUS
  Filled 2021-07-05 (×4): qty 1

## 2021-07-05 MED ORDER — LACTULOSE ENEMA
300.0000 mL | Freq: Once | ORAL | Status: AC
  Administered 2021-07-05: 300 mL via RECTAL
  Filled 2021-07-05: qty 300

## 2021-07-05 MED ORDER — FENTANYL CITRATE PF 50 MCG/ML IJ SOSY: 25.0000 ug | PREFILLED_SYRINGE | INTRAMUSCULAR | Status: DC | PRN

## 2021-07-05 MED ORDER — ONDANSETRON HCL 4 MG PO TABS
4.0000 mg | ORAL_TABLET | Freq: Four times a day (QID) | ORAL | Status: DC
  Administered 2021-07-05: 4 mg via ORAL
  Filled 2021-07-05 (×2): qty 1

## 2021-07-05 MED ORDER — LACTULOSE 10 GM/15ML PO SOLN: 30.0000 g | Freq: Three times a day (TID) | ORAL | Status: DC

## 2021-07-05 MED ORDER — LORAZEPAM 2 MG/ML IJ SOLN
0.5000 mg | INTRAMUSCULAR | Status: DC | PRN
  Administered 2021-07-05: 0.5 mg via INTRAVENOUS
  Filled 2021-07-05 (×2): qty 1

## 2021-07-05 NOTE — Progress Notes (Signed)
?PROGRESS NOTE ? ? ?Jennifer Pollard  UDT:[id number] DOB: 1986/01/02 DOA: 06/21/2021 ?PCP: Lindell Spar, MD  ? ?Chief Complaint  ?Patient presents with  ? Abdominal Pain  ? ?Level of care: Med-Surg ? ?Brief Admission History:  ?36 y.o. female with medical history significant of with history of tobacco use disorder, and marijuana use, presents to the ED with a chief complaint of abdominal swelling.  Patient reports that she had sudden onset of abdominal swelling in April 10.  She thinks she had about 10 pound weight gain.  She does have pain that is in the left lower quadrant that feels crampy and is intermittent.  Patient reports that today she started having diarrhea and had at least 5 episodes.  She denies hematochezia or melena.  She has no known liver problems.  She has no family history of liver disease.  Patient denies any fevers.  She does report a decrease in her urine output that started a few days ago.  She has had no dysuria or hematuria.  Patient denies peripheral edema.  Patient reports that she drinks about 3 times per year.  She has not had right upper quadrant pain.  She denies any high risk sexual behavior, reporting that she has 1 partner that she has been with for some time.  She does not use IV drugs.  Patient has no other complaints at this time. ?  ?Patient smokes cigarettes about a pack per day.  She does request a nicotine patch.  She is ready to quit.  She reports she drinks alcohol about 3 times per year.  She uses marijuana and the last use was 5 PM on the day of presentation.  She is vaccinated for COVID.  Patient is full code. ?  ?I have requested GI consultation and spent long time discussing what we know so far with patient who does not seem to grasp, father and call to brother.  I ordered US paracentesis with cytology and other testing as part of work up.  GI team ordered a lot of tests as part of work up.  We don't have answers right now, we are treating supportively and need to wait  for more data to come in.  Pt's liver exam is very impressive.  It is extremely enlarged and very hard to the touch.  Pt also has a large left breast mass.   ? ?07/02/2021: Pt had left breast core biospy done at [facility].  Pt scheduled to have liver biopsy at [facility] on 4/19.  Oncologist Dr. Delton Coombes consulted.   ?  ?Assessment and Plan: ?1) Acute liver failure--due to heavy tumor/metastatic burden ?- Scleral icterus and ascites on exam ?- Tylenol level less than 10 ?- Alcohol level less than 10 ?- CT scan shows cirrhosis with venous collaterals suggesting portal hypertension.  Innumerable hepatic lesions concerning for metastatic malignancy.  Completed MRI with liver protocol.  Moderate volume simple fluid ascites s/p paracentesis on 07/01/21 with removal of 1.7 L of ascitic fluid negative cytology on ascitic fluid ?-Follow up AFP, CEA, CA 19-9 ?-Acute viral hepatitis profile is negative ?-Bilirubin trending up ?-GI consult appreciated ? ?  Latest Ref Rng & Units 07/05/2021  ?  5:31 AM 07/04/2021  ?  5:12 AM 07/03/2021  ?  5:34 AM  ?Hepatic Function  ?Total Protein 6.5 - 8.1 g/dL 5.9   6.1   5.7    ?Albumin 3.5 - 5.0 g/dL 1.7   1.8   1.7    ?AST  15 - 41 U/L 608   695   668    ?ALT 0 - 44 U/L 104   115   115    ?Alk Phosphatase 38 - 126 U/L 190   204   203    ?Total Bilirubin 0.3 - 1.2 mg/dL 8.4   7.4   6.6    ?Bilirubin, Direct 0.0 - 0.2 mg/dL   4.2    ? 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 Delton Coombes, limited chemo options given elevated bilirubin/LFTs ?-MRI brain -without evidence of metastasis  ?-CT chest with contrast for staging shows lymphadenopathy ?-Pathology from liver biopsy on 07/03/2021 consistent with primary breast cancer with liver mets ?-Overall prognosis is poor/[place] ?-CA 27.29 is 1938.1.  CA 15-3 1979.  CEA 361.  ?-AFP 2,  CA 19-9 less than 2.  CA 27.2 -1938.1 and  CA 15-3 1979  ? ?3)Severe Lactic acidosis  ?-- Secondary to  presumed advanced stage metastatic breast cancer with liver metastases and subsequent cellular and tissue necrosis and presumed infectious colitis  ?-- continue current supportive measures and antibiotics  ? ?4)Adnexal mass ?- CT scan shows 2.5 x 2.2 cm right adnexal lesion.  ?-pelvic ultrasound was unrevealing, no mass was seen  ?-Pregnancy test negative ? ?5)Colitis ?- Reports 5-6 episodes of diarrhea prior to arrival ?-CT scan shows ascending and transverse colitis ?-WBC continues to trend up ?Treated with Cipro and Flagyl ?-Stopped Flagyl due to persistent emesis ?--Continue IV Unasyn, can be transitioned to Augmentin when patient is able to tolerate oral intake ? ?6)Tobacco use ?- - nicotine patch ? ?7) hypoglycemic episode--- continues to have episodes of hypoglycemia  ?-Requiring D50 infusion from time to time and continuous D5 solution infusion ?--continue IV dextrose solution until oral intake is more reliable ? ?8)social/ethics--- very poor prognosis overall due to stage IV metastatic left breast cancer with mets to the liver ?-Oncology and palliative care consult appreciated ?-07/05/2021 family conference with chaplain present along with with patient's dad, mom, sister [name], Sister Linwood Dibbles and brother [name]- ?-Family request DNR/DNI status ?-Patient is not full comfort care at this time, but family request enhancement of opiates and benzos to try to make patient more comfortable ? ?DVT prophylaxis: SCDs, elevated INR ?code Status: Full  ?Family Communication: mother, father, brother updated 4/17, 4/18  ?Disposition: Status is: Inpatient ?Remains inpatient appropriate because: ongoing work up for metastatic breast cancer  ?-Overall prognosis is very poor, unable to discharge at this time due to persistent emesis, recurrent severe hypoglycemic episodes despite continuous D5 IV infusion in the setting of very very poor oral intake ?  ?Consultants:  ?GI ?Oncology  ?IR ?Palliative care ?Procedures:  ?Left  breast biopsy 07/02/21 at [facility] ?IR liver biopsy at [facility] on 4/19 ?Antimicrobials:  ?Cipro >> stopped 07/03/21 ?Flagyl  >>stopped 07/03/21 ?Started Unasyn 07/03/2021 ? ?Subjective: ?-Was emesis free for several hours,  ?starting to have emesis again at this time ?-Had BM with lactulose enema ? ?Objective: ?Vitals:  ? 07/04/21 1631 07/04/21 2028 07/05/21 0526 07/05/21 1347  ?BP:  112/90 (!) 123/92 112/82  ?Pulse: 80 90 82 100  ?Resp:  _0 ?Temp:  (!) 97.2 ?F (36.2 ?C) 98.7 ?F (37.1 ?C) 98 ?F (36.7 ?C)  ?TempSrc:  Oral  Oral  ?SpO2: 91% 93% (!) 81% 98%  ?Weight:      ?Height:      ? ? ?Intake/Output Summary (Last 24 hours) at  07/05/2021 1811 ?Last data filed at 07/05/2021 1400 ?Gross per 24 hour  ?Intake 1932.43 ml  ?Output 2 ml  ?Net 1930.43 ml  ? ?Filed Weights  ? 07/01/21 0400 07/02/21 0510 07/03/21 0700  ?Weight: 76.2 kg 76.2 kg 77.4 kg  ? ?Examination: ? ?Physical Exam ?Gen:-Looks tired after another episode of emesis ?HEENT:- [place].AT, significant sclera icterus ?Breast--previously known left breast mass ?Neck-Supple Neck,No JVD,.  ?Lungs-  CTAB , fair air movement bilaterally  ?CV- S1, S2 normal, RRR ?Abd-  +ve B.Sounds, Abd Soft, generalized discomfort without rebound or guarding ?Extremity/Skin:- No  edema,   good pedal pulses  ?Psych-affect is flat, oriented x3 ?Neuro-generalized weakness, no new focal deficits, no tremors ? ? ?Data Reviewed: I have personally reviewed following labs and imaging studies ? ?CBC: ?Recent Labs  ?Lab 07/01/21 ?0326 07/02/21 ?0507 07/03/21 ?2919 07/04/21 ?1660 07/05/21 ?6004  ?WBC 12.1* 13.6* 15.2* 18.0* 20.5*  ?NEUTROABS 8.7* 8.3* 10.6* 13.5* 15.4*  ?HGB 12.3 10.6* 10.6* 11.3* 11.3*  ?HCT 37.9 31.7* 31.5* 33.8* 33.9*  ?MCV 91.8 90.8 90.0 91.4 92.4  ?PLT 158 134* 125* 138* 119*  ? ? ?Basic Metabolic Panel: ?Recent Labs  ?Lab 07/01/21 ?0326 07/02/21 ?0507 07/03/21 ?5997 07/04/21 ?7414 07/05/21 ?2395  ?NA 133* 129* 129* 129* 133*  ?K 3.8 4.1 4.6 4.4 4.4  ?CL 98 97* 97* 97* 100  ?CO2  21* 19* 20* 18* 17*  ?GLUCOSE 95 95 67* 73 80  ?BUN 18 22* 24* 27* 25*  ?CREATININE 0.85 0.83 0.74 0.88 0.79  ?CALCIUM 8.5* 8.2* 8.1* 8.2* 8.3*  ?MG 2.3  --   --   --   --   ? ? ?CBG: ?Recent Labs  ?La

## 2021-07-05 NOTE — Progress Notes (Signed)
No IV access this am.CBG 47 this am reported from [employer]. Will continue to monitor patient. ?

## 2021-07-05 NOTE — Progress Notes (Signed)
This writer walked in to room to give pt medication, observed pt kneeling on both knees with mother at her side. Before I could fully get in the room pt had already stood up and got herself back in the bed. Pt did not have any complaints of pain, did not appear to have any injuries at this time. Pt received IV Ativan and Fentanyl to relieve restlessness and pain. Pt currently resting in bed with family at bedside. Pt does appear more relaxed and comfortable at this time. Will continue to monitor closely. ?

## 2021-07-05 NOTE — Progress Notes (Signed)
social/ethics--- very poor prognosis overall due to stage IV metastatic left breast cancer with mets to the liver ?-Oncology and palliative care consult appreciated ?-07/05/2021 family conference with chaplain present along with with patient's dad, mom, sister [place] Nickel and brother [name]- ?-Family request DNR/DNI status ?-Patient is not full comfort care at this time, but family request enhancement of opiates and benzos to try to make patient more comfortable ?  ?

## 2021-07-05 NOTE — Progress Notes (Signed)
Ultrasound unsuccessful at placing IV after several attempts. Patient has had hypoglycemic episodes and had D5%/0.9% NaCl @ 125, abx Unasyn, and Phenergan IV. Will need new access.  ?

## 2021-07-05 NOTE — Progress Notes (Signed)
Blood glucose was 47 @ 0722. Gave patient cola and she drank a few sips. Drank 128m grape juice w/ 5 sugar packets and a glucose tablet. She c/o nausea. Will continue to monitor.  ?

## 2021-07-05 NOTE — Consult Note (Signed)
Iv attempted X2 without success, additional RN to attempt,  ?

## 2021-07-05 NOTE — Progress Notes (Signed)
? ?Gastroenterology Progress Note  ? ?Referring Provider: No ref. provider found ?Primary Care Physician:  Lindell Spar, MD ?Primary Gastroenterologist:  Dr. Abbey Chatters ? ?Patient ID: Jennifer Pollard; [id number]; 1985-09-12  ? ? ?Subjective  ? ?Patient continue to have lack of appetite. She reports not feeling well. She has not had any vomiting since yesterday and has had a bowel movement today. No overt abdominal pain at this time. Denies shortness of breath or chest pains. Family at bedside reporting patient has not had much to eat today and that she has been pretty restless.  ? ? ?Objective  ? ?Vital signs in last 24 hours ?Temp:  [97.2 ?F (36.2 ?C)-98.7 ?F (37.1 ?C)] 98.7 ?F (37.1 ?C) (04/21 0526) ?Pulse Rate:  [44-90] 82 (04/21 0526) ?Resp:  [18-19] 18 (04/21 0526) ?BP: (106-123)/(70-92) 123/92 (04/21 0526) ?SpO2:  [81 %-93 %] 81 % (04/21 0526) ?Last BM Date : 07/05/21 ? ?Physical Exam ?General:   Alert and oriented, pleasant ?Eyes:  Scleral icterus. Conjuctiva pink.  ?Mouth:  Without lesions, mucosa pink and moist.   ?Heart:  S1, S2 present, no murmurs noted.  ?Chest: Left breast hardened, some dimpling, and areola darker than right breast ?Lungs: Clear to auscultation bilaterally, without wheezing, rales, or rhonchi.  ?Abdomen: Abdomen distended,soft, ascites, profound hepatomegaly. Bowel sounds present, soft, non-tender, non-distended. No rebound or guarding. ?Msk:  Symmetrical without gross deformities. Normal posture. ?Extremities:  Without clubbing or edema. ?Neurologic:  Alert and  oriented x4; ?Psych:  Alert and cooperative. Normal mood and affect. restless ? ?Intake/Output from previous day: ?04/20 0701 - 04/21 0700 ?In: 2145.4 [P.O.:360; I.V.:1235.4; IV CHYIFOYDX:412] ?Out: -  ?Intake/Output this shift: ?Total I/O ?In: 120 [P.O.:120] ?Out: -  ? ?Lab Results ? ?Recent Labs  ?  07/03/21 ?8786 07/04/21 ?7672 07/05/21 ?0947  ?WBC 15.2* 18.0* 20.5*  ?HGB 10.6* 11.3* 11.3*  ?HCT 31.5* 33.8* 33.9*  ?PLT 125*  138* 119*  ? ?BMET ?Recent Labs  ?  07/03/21 ?0962 07/04/21 ?8366 07/05/21 ?2947  ?NA 129* 129* 133*  ?K 4.6 4.4 4.4  ?CL 97* 97* 100  ?CO2 20* 18* 17*  ?GLUCOSE 67* 73 80  ?BUN 24* 27* 25*  ?CREATININE 0.74 0.88 0.79  ?CALCIUM 8.1* 8.2* 8.3*  ? ?LFT ?Recent Labs  ?  07/03/21 ?6546 07/04/21 ?5035 07/05/21 ?4656  ?PROT 5.7* 6.1* 5.9*  ?ALBUMIN 1.7* 1.8* 1.7*  ?AST 668* 695* 608*  ?ALT 115* 115* 104*  ?ALKPHOS 203* 204* 190*  ?BILITOT 6.6* 7.4* 8.4*  ?BILIDIR 4.2*  --   --   ? ?PT/INR ?Recent Labs  ?  07/04/21 ?8127 07/05/21 ?5170  ?LABPROT 23.7* 24.8*  ?INR 2.1* 2.3*  ? ?Hepatitis Panel ?No results for input(s): HEPBSAG, HCVAB, HEPAIGM, HEPBIGM in the last 72 hours. ? ? ?Studies/Results ?CT CHEST W CONTRAST ? ?Result Date: 07/03/2021 ?CLINICAL DATA:  Stage IV breast carcinoma EXAM: CT CHEST WITH CONTRAST TECHNIQUE: Multidetector CT imaging of the chest was performed during intravenous contrast administration. RADIATION DOSE REDUCTION: This exam was performed according to the departmental dose-optimization program which includes automated exposure control, adjustment of the mA and/or kV according to patient size and/or use of iterative reconstruction technique. CONTRAST:  72m OMNIPAQUE IOHEXOL 300 MG/ML  SOLN COMPARISON:  None. FINDINGS: Cardiovascular: There is homogeneous enhancement in thoracic aorta. There are no filling defects in the central pulmonary artery branches. There is ectasia of main pulmonary artery measuring 3.5 cm suggesting pulmonary arterial hypertension. Ascending thoracic aorta measures 3.7 cm. Mediastinum/Nodes: There are few  enlarged lymph nodes in the mediastinum each measuring less than 10 mm in short axis. There are abnormally enlarged lymph nodes in both axillary regions. Largest of the nodes in the left axilla measures 2 x 1.8 cm. Largest of the nodes in the right axilla measures 1.4 x 1.3 cm. There are few subcentimeter nodes in the supraclavicular region. Lungs/Pleura: There are patchy  infiltrates in both lower lobes, more so on the right side suggesting atelectasis/pneumonia. Small linear densities in the lingula and right middle lobe may suggest subsegmental atelectasis. There is small right pleural effusion. There is minimal left pleural effusion. Upper Abdomen: There are numerous low-attenuation space-occupying lesions of varying sizes scattered throughout the liver suggesting extensive hepatic metastatic disease. Small hiatal hernia is seen. Musculoskeletal: Patchy mixed density is seen in the sternum. Degenerative changes are noted in the visualized lower cervical spine. There is abnormal skin thickening in both breasts, more so on the left side. Both breasts are dense, more so in the left breast. IMPRESSION: There are numerous low-density space-occupying lesions of varying sizes scattered throughout the liver suggesting extensive hepatic metastatic disease. There is mixed density in the sternum suggesting possible skeletal metastatic disease. There are enlarged lymph nodes in both axillary regions and mediastinum suggesting possible metastatic lymphadenopathy. Largest of the nodes is seen in the left axilla measuring 2 x 1.8 cm. There is skin thickening in both breasts, more so on the left side. Dense breast parenchyma is seen in both breasts. This may be due to primary malignant breast neoplasm. Bilateral pleural effusions, more so on the right side. There are patchy infiltrates in both lower lung fields, more so on the right side suggesting atelectasis/pneumonia. There is ectasia of main pulmonary artery suggesting possible pulmonary arterial hypertension. Other findings as described in the body of the report. Electronically Signed   By: Elmer Picker M.D.   On: 07/03/2021 18:52  ? ?MR BRAIN WO CONTRAST ? ?Result Date: 07/03/2021 ?CLINICAL DATA:  Breast cancer staging EXAM: MRI HEAD WITHOUT CONTRAST TECHNIQUE: Multiplanar, multiecho pulse sequences of the brain and surrounding  structures were obtained without intravenous contrast. COMPARISON:  None. FINDINGS: Limited examination. Only sagittal T1, coronal and axial diffusion-weighted imaging and axial T2 weighted sequences were obtained. There is no acute infarct. There is no midline shift or other mass effect. IMPRESSION: Truncated examination. No acute infarct. Electronically Signed   By: Ulyses Jarred M.D.   On: 07/03/2021 19:14  ? ?MR ABDOMEN W WO CONTRAST ? ?Result Date: 07/01/2021 ?CLINICAL DATA:  Inpatient. Ascites. Elevated liver function tests. Liver masses. EXAM: MRI ABDOMEN WITHOUT AND WITH CONTRAST TECHNIQUE: Multiplanar multisequence MR imaging of the abdomen was performed both before and after the administration of intravenous contrast. CONTRAST:  66m GADAVIST GADOBUTROL 1 MMOL/ML IV SOLN COMPARISON:  06/21/2021 CT abdomen/pelvis. FINDINGS: Substantially motion degraded study, significantly limiting assessment. Lower chest: No acute abnormality at the lung bases. Hepatobiliary: Hepatomegaly. Liver parenchyma is nearly entirely replaced by innumerable confluent bulky hypoenhancing liver masses throughout the liver, which distort the liver capsule. Representative liver masses as follows: -posterosuperior right liver 12.5 x 12.0 cm mass (series 14/image 42), likely representing multiple confluent masses -segment 8 right liver 4.0 x 2.6 cm mass (series 14/image 50) -segment 2 left liver 4.4 x 4.1 cm mass (series 14/image 50) -segment 4B left liver 6.2 x 5.4 cm mass (series 14/image 69) No discrete cholelithiasis. No gallbladder distention. Mild diffuse gallbladder wall thickening. No biliary ductal dilatation. Common bile duct diameter 2 mm. No evidence  of choledocholithiasis. Pancreas: No pancreatic mass or duct dilation. Spleen: Normal size. No mass. Adrenals/Urinary Tract: Normal adrenals. No hydronephrosis. Normal kidneys with no renal mass. Stomach/Bowel: Small hiatal hernia. Otherwise normal nondistended stomach. Normal  caliber small and large bowel. Mild wall thickening throughout the visualized right colon. No definite small bowel wall thickening. Vascular/Lymphatic: Normal caliber abdominal aorta. Patent renal, portal and spl

## 2021-07-06 DIAGNOSIS — N9489 Other specified conditions associated with female genital organs and menstrual cycle: Secondary | ICD-10-CM | POA: Diagnosis not present

## 2021-07-06 DIAGNOSIS — K529 Noninfective gastroenteritis and colitis, unspecified: Secondary | ICD-10-CM | POA: Diagnosis not present

## 2021-07-06 DIAGNOSIS — K7200 Acute and subacute hepatic failure without coma: Secondary | ICD-10-CM | POA: Diagnosis not present

## 2021-07-06 DIAGNOSIS — R932 Abnormal findings on diagnostic imaging of liver and biliary tract: Secondary | ICD-10-CM | POA: Diagnosis not present

## 2021-07-06 LAB — CBC WITH DIFFERENTIAL/PLATELET
Band Neutrophils: 3 %
Basophils Absolute: 0 10*3/uL (ref 0.0–0.1)
Basophils Relative: 0 %
Eosinophils Absolute: 0 10*3/uL (ref 0.0–0.5)
Eosinophils Relative: 0 %
HCT: 27 % — ABNORMAL LOW (ref 36.0–46.0)
Hemoglobin: 8 g/dL — ABNORMAL LOW (ref 12.0–15.0)
Lymphocytes Relative: 10 %
Lymphs Abs: 3 10*3/uL (ref 0.7–4.0)
MCH: 30.8 pg (ref 26.0–34.0)
MCHC: 29.6 g/dL — ABNORMAL LOW (ref 30.0–36.0)
MCV: 103.8 fL — ABNORMAL HIGH (ref 80.0–100.0)
Metamyelocytes Relative: 3 %
Monocytes Absolute: 4.6 10*3/uL — ABNORMAL HIGH (ref 0.1–1.0)
Monocytes Relative: 15 %
Neutro Abs: 21.9 10*3/uL — ABNORMAL HIGH (ref 1.7–7.7)
Neutrophils Relative %: 69 %
Platelets: 114 10*3/uL — ABNORMAL LOW (ref 150–400)
RBC: 2.6 MIL/uL — ABNORMAL LOW (ref 3.87–5.11)
RDW: 24.9 % — ABNORMAL HIGH (ref 11.5–15.5)
WBC: 30.4 10*3/uL — ABNORMAL HIGH (ref 4.0–10.5)
nRBC: 11.7 % — ABNORMAL HIGH (ref 0.0–0.2)
nRBC: 13 /100 WBC — ABNORMAL HIGH

## 2021-07-06 LAB — AMMONIA: Ammonia: 79 umol/L — ABNORMAL HIGH (ref 9–35)

## 2021-07-06 LAB — GLUCOSE, CAPILLARY
Glucose-Capillary: 148 mg/dL — ABNORMAL HIGH (ref 70–99)
Glucose-Capillary: 150 mg/dL — ABNORMAL HIGH (ref 70–99)
Glucose-Capillary: 24 mg/dL — CL (ref 70–99)
Glucose-Capillary: 39 mg/dL — CL (ref 70–99)
Glucose-Capillary: 40 mg/dL — CL (ref 70–99)
Glucose-Capillary: 41 mg/dL — CL (ref 70–99)
Glucose-Capillary: 45 mg/dL — ABNORMAL LOW (ref 70–99)
Glucose-Capillary: 46 mg/dL — ABNORMAL LOW (ref 70–99)
Glucose-Capillary: 52 mg/dL — ABNORMAL LOW (ref 70–99)
Glucose-Capillary: 53 mg/dL — ABNORMAL LOW (ref 70–99)
Glucose-Capillary: 55 mg/dL — ABNORMAL LOW (ref 70–99)
Glucose-Capillary: 55 mg/dL — ABNORMAL LOW (ref 70–99)
Glucose-Capillary: 63 mg/dL — ABNORMAL LOW (ref 70–99)

## 2021-07-06 LAB — COMPREHENSIVE METABOLIC PANEL
ALT: 92 U/L — ABNORMAL HIGH (ref 0–44)
AST: 517 U/L — ABNORMAL HIGH (ref 15–41)
Albumin: <1.5 g/dL — ABNORMAL LOW (ref 3.5–5.0)
Alkaline Phosphatase: 144 U/L — ABNORMAL HIGH (ref 38–126)
BUN: 30 mg/dL — ABNORMAL HIGH (ref 6–20)
CO2: <7 mmol/L — ABNORMAL LOW (ref 22–32)
Calcium: 8.5 mg/dL — ABNORMAL LOW (ref 8.9–10.3)
Chloride: 103 mmol/L (ref 98–111)
Creatinine, Ser: 1.28 mg/dL — ABNORMAL HIGH (ref 0.44–1.00)
GFR, Estimated: 55 mL/min — ABNORMAL LOW (ref >60)
Glucose, Bld: 166 mg/dL — ABNORMAL HIGH (ref 70–99)
Potassium: 4.9 mmol/L (ref 3.5–5.1)
Sodium: 134 mmol/L — ABNORMAL LOW (ref 135–145)
Total Bilirubin: 8 mg/dL — ABNORMAL HIGH (ref 0.3–1.2)
Total Protein: 4.7 g/dL — ABNORMAL LOW (ref 6.5–8.1)

## 2021-07-06 LAB — CULTURE, BODY FLUID W GRAM STAIN -BOTTLE
Culture: NO GROWTH
Special Requests: ADEQUATE

## 2021-07-06 LAB — PROTIME-INR
INR: 3.1 — ABNORMAL HIGH (ref 0.8–1.2)
Prothrombin Time: 31.5 seconds — ABNORMAL HIGH (ref 11.4–15.2)

## 2021-07-06 MED ORDER — GLUCOSE 40 % PO GEL
1.0000 | Freq: Once | ORAL | Status: AC
  Administered 2021-07-06: 31 g via ORAL

## 2021-07-06 MED ORDER — DEXTROSE 10 % IV SOLN: INTRAVENOUS | Status: DC

## 2021-07-06 MED ORDER — DEXTROSE 50 % IV SOLN
25.0000 g | INTRAVENOUS | Status: AC
  Administered 2021-07-06: 25 g via INTRAVENOUS

## 2021-07-06 MED ORDER — HYDROMORPHONE HCL 2 MG PO TABS
2.0000 mg | ORAL_TABLET | ORAL | Status: DC | PRN
  Administered 2021-07-06: 4 mg via ORAL
  Filled 2021-07-06: qty 2

## 2021-07-06 MED ORDER — LORAZEPAM 2 MG/ML IJ SOLN
0.5000 mg | INTRAMUSCULAR | Status: DC | PRN
  Administered 2021-07-06 (×2): 0.5 mg via SUBLINGUAL
  Filled 2021-07-06: qty 1

## 2021-07-08 ENCOUNTER — Telehealth: Payer: Self-pay | Admitting: Genetic Counselor

## 2021-07-08 NOTE — Telephone Encounter (Signed)
error 

## 2021-07-09 ENCOUNTER — Ambulatory Visit (HOSPITAL_COMMUNITY): Payer: Medicaid Other | Admitting: Hematology

## 2021-07-09 LAB — TOTAL BILIRUBIN, BODY FLUID: Total bilirubin, fluid: 0.5 mg/dL

## 2021-07-15 NOTE — Progress Notes (Signed)
Patient family visitation completed.  ?Funeral home information provided by father, [name] Kolinski as [facility] in [place]/ [phone]. Patient post-mortem care completed. Eye prep placed.  ?Post-mortem checklist completed. Patient Placement staff notified of completion.  ?Patient transported to [place] and checked-in with Neurosurgeon.  ?Notified Patient Placement staff of transport to [place]. ?

## 2021-07-15 NOTE — Progress Notes (Addendum)
Came to bedside to assess patient after sister at bedside notified nursing staff that dark colored liquid was coming from her nose and mouth. ? ?Upon assessing patient, no respirations auscultated. No heart sounds auscultated. Patient not responsive to sternal rub.  ?Charge RN Izora Gala notified and she came to bedside to assess patient as well.  ? ?Dr. Denton Brick paged to bedside as patient has expired. Time of death: 08:40 AM.  ?No orders for two RN to pronounce, Dr. Fredric Dine notified of no orders to pronounce.  ?

## 2021-07-15 NOTE — Death Summary Note (Signed)
? ?DEATH SUMMARY  ? ?Patient Details  ?Name: Jennifer Pollard ?MRN: [id number] ?DOB: [birth year] ?LDJ:TTSVX, Jennifer Broach, MD ?Admission/Discharge Information  ? ?Admit Date:  2021-07-07  ?Date of Death:  July 13, 2021   ?Time of Death:  0840 am  ?Length of Stay: 5  ? ?Principle Cause of death: Stage IV metastatic breast cancer ? ?Hospital Diagnoses: ?Principal Problem: ?  Acute liver failure ?Active Problems: ?  Left breast mass ?  Tobacco use ?  Hypoalbuminemia ?  Colitis ?  Adnexal mass ?  Severe Lactic acidosis  ?  Abdominal distension ?  Transaminitis ?  Abnormal liver diagnostic imaging ?  Other ascites ? ? ?Hospital Course: ?Brief Admission History:  ?36 y.o. female with medical history significant of with history of tobacco use disorder, and marijuana use, presents to the ED with a chief complaint of abdominal swelling.  Patient reports that she had sudden onset of abdominal swelling in April 10.  She thinks she had about 10 pound weight gain.  She does have pain that is in the left lower quadrant that feels crampy and is intermittent.  Patient reports that today she started having diarrhea and had at least 5 episodes.  She denies hematochezia or melena.  She has no known liver problems.  She has no family history of liver disease.  Patient denies any fevers.  She does report a decrease in her urine output that started a few days ago.  She has had no dysuria or hematuria.  Patient denies peripheral edema.  Patient reports that she drinks about 3 times per year.  She has not had right upper quadrant pain.  She denies any high risk sexual behavior, reporting that she has 1 partner that she has been with for some time.  She does not use IV drugs.  Patient has no other complaints at this time. ?  ?Patient smokes cigarettes about a pack per day.  She does request a nicotine patch.  She is ready to quit.  She reports she drinks alcohol about 3 times per year.  She uses marijuana and the last use was 5 PM on the day of  presentation.  She is vaccinated for COVID. ?  Pt's liver exam is very impressive.  It is extremely enlarged and very hard to the touch.  Pt also has a large left breast mass.   ?  ?07/02/2021: Pt had left breast core biospy done at [facility].   ?Pt had liver biopsy at [facility] IR on 4/19.  ? Oncologist Dr. Delton Pollard, GI service and palliative care consulted ?-Pathology from breast and liver biopsy consistent with adenocarcinoma of the breast with mets to the liver ? ?07/13/2021- ?-Patient passed away peacefully at 8:40 AM with family at bedside ? ?Assessment and Plan: ?1) Acute liver failure--due to heavy tumor/metastatic burden ?- Scleral icterus and ascites on exam ?- Tylenol level less than 10 ?- Alcohol level less than 10 ?- CT scan shows cirrhosis with venous collaterals suggesting portal hypertension.  Innumerable hepatic lesions concerning for metastatic malignancy.  Completed MRI with liver protocol.  Moderate volume simple fluid ascites s/p paracentesis on 07/01/21 with removal of 1.7 L of ascitic fluid negative cytology on ascitic fluid ?-Follow up AFP, CEA, CA 19-9 ?-Acute viral hepatitis profile is negative ?-Bilirubin trending up ? ?  Latest Ref Rng & Units 07/13/2021  ?  7:52 AM 07/05/2021  ?  5:31 AM 07/04/2021  ?  5:12 AM  ?Hepatic Function  ?Total Protein 6.5 - 8.1 g/dL  4.7   5.9   6.1    ?Albumin 3.5 - 5.0 g/dL <1.5   1.7   1.8    ?AST 15 - 41 U/L 517   608   695    ?ALT 0 - 44 U/L 92   104   115    ?Alk Phosphatase 38 - 126 U/L 144   190   204    ?Total Bilirubin 0.3 - 1.2 mg/dL 8.0   8.4   7.4    ? ?Lab Results  ?Component Value Date  ? INR 3.1 (H) Jul 31, 2021  ? INR 2.3 (H) 07/05/2021  ? INR 2.1 (H) 07/04/2021  ? ?Ammonia 47 >> 116>>79 after oral lactulose and lactulose enema ?-31-Jul-2021- ?-Patient passed away peacefully at 8:40 AM with family at bedside ? 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 Delton Pollard, limited chemo options given elevated  bilirubin/LFTs ?-MRI brain -without evidence of metastasis  ?-CT chest with contrast for staging shows lymphadenopathy ?-Pathology from liver biopsy on 07/03/2021 consistent with primary breast cancer with liver mets ?-Overall prognosis is poor/[facility] ?-CA 27.29 is 1938.1.  CA 15-3 1979.  CEA 361.  ?-AFP 2,  CA 19-9 less than 2.  CA 27.2 -1938.1 and  CA 15-3 1979  ? -07/31/2021- ?-Patient passed away peacefully at 8:40 AM with family at bedside ? ?3)Severe Lactic acidosis  ?-- Secondary to presumed advanced stage metastatic breast cancer with liver metastases and subsequent cellular and tissue necrosis and presumed infectious colitis  ?2021-07-31- ?-Patient passed away peacefully at 8:40 AM with family at bedside  ?  ?4)Adnexal mass ?- CT scan shows 2.5 x 2.2 cm right adnexal lesion.  ?-pelvic ultrasound was unrevealing, no mass was seen  ?-Pregnancy test negative ?  ?5)Colitis ?- Reports 5-6 episodes of diarrhea prior to arrival ?-CT scan shows ascending and transverse colitis ?-WBC continues to trend up ?Treated with Cipro and Flagyl ?-Stopped Flagyl due to persistent emesis ?-Treated with IV Unasyn,  ?Jul 31, 2021- ?-Patient passed away peacefully at 8:40 AM with family at bedside ? ?6) hypoglycemic episode--- continues to have episodes of hypoglycemia  ?-Requiring D50 infusion from time to time and continuous D5 solution infusion ? ?  ?7)social/ethics--- very poor prognosis overall due to stage IV metastatic left breast cancer with mets to the liver ?-Oncology and palliative care consult appreciated ?-07/05/2021 family conference with chaplain present along with with patient's dad, mom, sister Jennifer Pollard, Sister Jennifer Pollard and brother Jennifer Pollard- ?-Family request DNR/DNI status ?-Patient is not full comfort care at this time, but family request enhancement of opiates and benzos to try to make patient more comfortable ?07/31/2021- ?-Patient passed away peacefully at 8:40 AM with family at bedside ?  ?Consultants:  ?GI ?Oncology   ?IR ?Palliative care ?Procedures:  ?Left breast biopsy 07/02/21 at [facility] ?[facility] liver biopsy at [facility] on 4/19 ?Antimicrobials:  ?Cipro >> stopped 07/03/21 ?Flagyl  >>stopped 07/03/21 ?Started Unasyn 07/03/2021 ? ? ?Procedures: Left breast and liver biopsy by ? ? ?The results of significant diagnostics from this hospitalization (including imaging, microbiology, ancillary and laboratory) are listed below for reference.  ? ?Significant Diagnostic Studies: ?CT CHEST W CONTRAST ? ?Result Date: 07/03/2021 ?CLINICAL DATA:  Stage IV breast carcinoma EXAM: CT CHEST WITH CONTRAST TECHNIQUE: Multidetector CT imaging of the chest was performed during intravenous contrast administration. RADIATION DOSE REDUCTION: This exam was performed according to the departmental dose-optimization program which includes automated exposure control, adjustment of the mA and/or  kV according to patient size and/or use of iterative reconstruction technique. CONTRAST:  26m OMNIPAQUE IOHEXOL 300 MG/ML  SOLN COMPARISON:  None. FINDINGS: Cardiovascular: There is homogeneous enhancement in thoracic aorta. There are no filling defects in the central pulmonary artery branches. There is ectasia of main pulmonary artery measuring 3.5 cm suggesting pulmonary arterial hypertension. Ascending thoracic aorta measures 3.7 cm. Mediastinum/Nodes: There are few enlarged lymph nodes in the mediastinum each measuring less than 10 mm in short axis. There are abnormally enlarged lymph nodes in both axillary regions. Largest of the nodes in the left axilla measures 2 x 1.8 cm. Largest of the nodes in the right axilla measures 1.4 x 1.3 cm. There are few subcentimeter nodes in the supraclavicular region. Lungs/Pleura: There are patchy infiltrates in both lower lobes, more so on the right side suggesting atelectasis/pneumonia. Small linear densities in the lingula and right middle lobe may suggest subsegmental atelectasis. There is small right pleural effusion. There is  minimal left pleural effusion. Upper Abdomen: There are numerous low-attenuation space-occupying lesions of varying sizes scattered throughout the liver suggesting extensive hepatic metastatic disease. Small hiatal hernia is

## 2021-07-15 NOTE — Progress Notes (Deleted)
Pharmacy Antibiotic Note ? ?[name] is a 36 y.o. female admitted on 06/15/2021 with  colitis .  Pharmacy has been consulted for Unasyn dosing. ? ?Pt with poor prognosis due to liver failure. Pt was on cipro/flagyl for colitis but couldn't tolerate due to emesis. We will use Unasyn instead.  ? ?Plan: ?Unasyn 3g IV q8 ? ?Height: '5\' 4"'$  (162.6 cm) ?Weight: 77.4 kg (170 lb 10.2 oz) ?IBW/kg (Calculated) : 54.7 ? ?Temp (24hrs), Avg:97.8 ?F (36.6 ?C), Min:97.6 ?F (36.4 ?C), Max:98 ?F (36.7 ?C) ? ?Recent Labs  ?Lab 07/01/21 ?0015 07/01/21 ?0326 07/02/21 ?0507 07/03/21 ?8250 07/04/21 ?0370 07/05/21 ?4888 07-09-21 ?9169  ?WBC  --  12.1* 13.6* 15.2* 18.0* 20.5* 30.4*  ?CREATININE  --  0.85 0.83 0.74 0.88 0.79 1.28*  ?LATICACIDVEN 7.9* 8.1*  --   --   --   --   --   ? ?  ?Estimated Creatinine Clearance: 60.6 mL/min (A) (by C-G formula based on SCr of 1.28 mg/dL (H)).   ? ?No Known Allergies ? ?Antimicrobials this admission: ?4/19 Unasyn >> ?4/18 cipro>>4/19 ?4/18 flagyl>>4/19 ? ?Dose adjustments this admission: ? ? ?Microbiology results: ?4/17 peritoneal>>ngtd ?4/17 GI panel>>neg ? ?Hart Robinsons, PharmD ?Clinical Pharmacist ? ?July 09, 2021 11:52 AM ? ? ?

## 2021-07-15 NOTE — Progress Notes (Signed)
Pt became restless even after prn ativan and fentanyl given, pt took out IV, 2 different attempts of ultrasound IV started without success. Dr. Arville Go aware, suggestion of picc line made to Dr. Abbott Pao had 1 episode of vomitting. PRN compazine given rectally with relief.  Pt became agitated about getting up to use the bathroom, pt reminded of fall earlier in the day, pt was able to be assissted to bedside commode with small bowel movement noted. Dr. Romie Jumper meds from IV meds to po. PO oxy given and dilaudid , as well as Ativan. Pt appears to be slightly calmer.  ?Pt blood sugar have been in the 50's, 2 orange juices and and ice cream consumed by patient with encouragement blood sugar continues to stay low currently in the 40s. M.D. made aware. Pt finally had IV access, given D5 amp per protocol and started on D10 per dr. Montine Circle. Next blood sugar 150. Pt then received IV medicine an appears to be able to relax. ?SMT LOKEY ?07/11/21 ?6:07 AM ? ?

## 2021-07-15 DEATH — deceased

## 2021-07-31 ENCOUNTER — Encounter: Payer: Self-pay | Admitting: Genetic Counselor

## 2021-07-31 DIAGNOSIS — Z1379 Encounter for other screening for genetic and chromosomal anomalies: Secondary | ICD-10-CM | POA: Insufficient documentation

## 2021-08-05 ENCOUNTER — Telehealth: Payer: Self-pay | Admitting: Genetic Counselor

## 2021-08-05 NOTE — Telephone Encounter (Signed)
Discussed results with her brother Lavone Neri. Revealed negative genetic testing.  Discussed that we do not know why Gurnoor had breast cancer oat such a young age. It does not appear to be due to a hereditary cause, which is good news for her son and her brother.  It could be due to a different gene that we are not testing, or maybe our current technology may not be able to pick something up.  It will be important for her to keep in contact with genetics to keep up with whether additional testing may be needed. I will send a letter with the test result and a copy of my card so they can keep in contact with [place].

## 2021-08-06 ENCOUNTER — Encounter: Payer: Self-pay | Admitting: Genetic Counselor

## 2022-06-29 IMAGING — DX DG ABDOMEN ACUTE W/ 1V CHEST
4 series · 4 of 4 positions shown · non-contrast
Comparison: None.

CLINICAL DATA: Abdominal swelling.

EXAM:
DG ABDOMEN ACUTE WITH 1 VIEW CHEST

[chest pa]
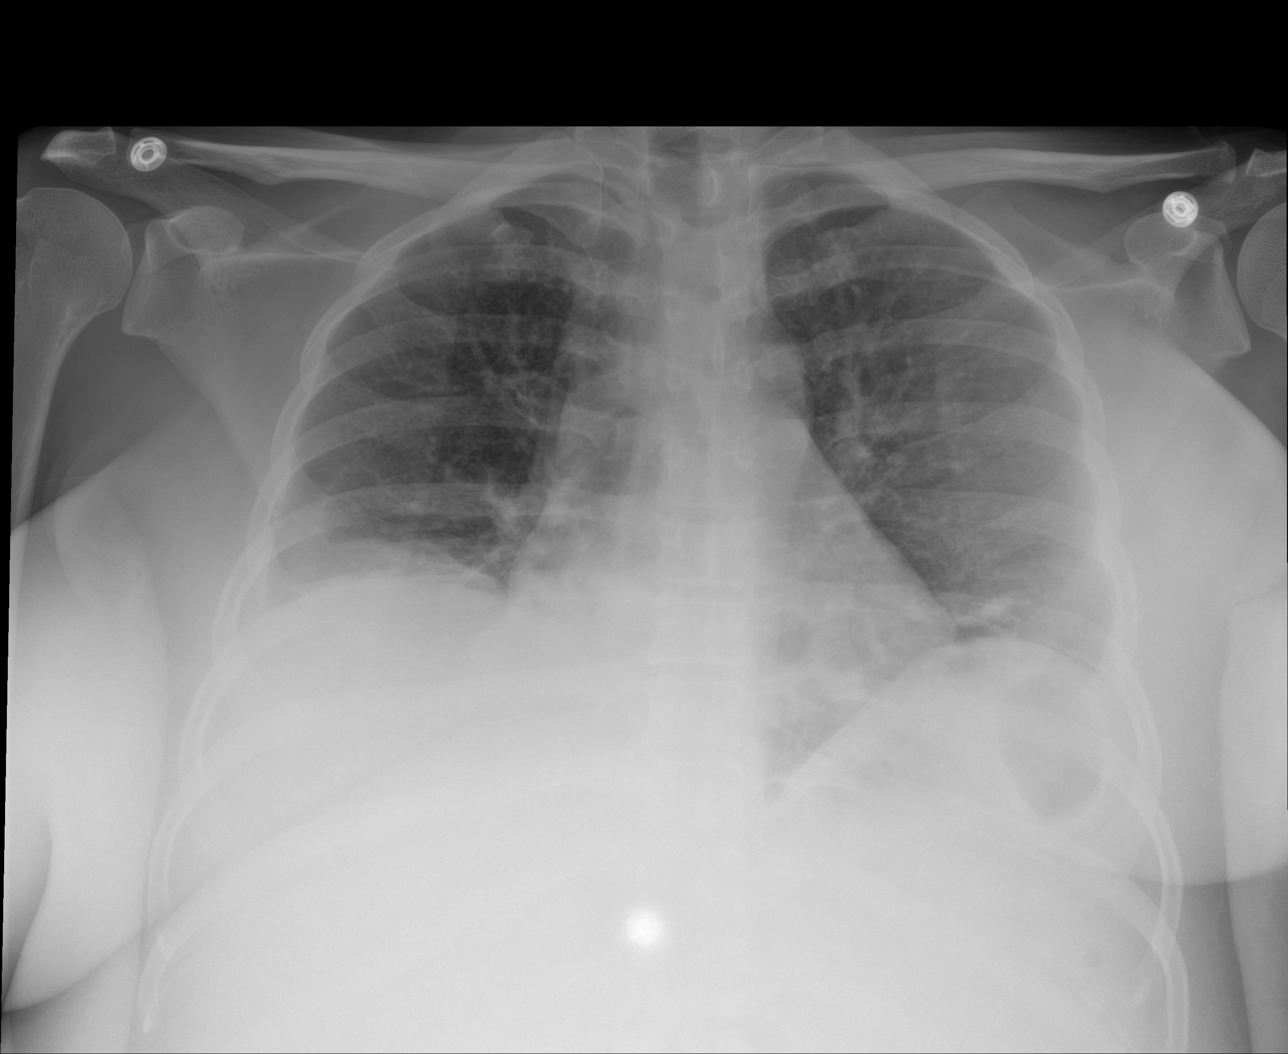

[abdomen erect ap]
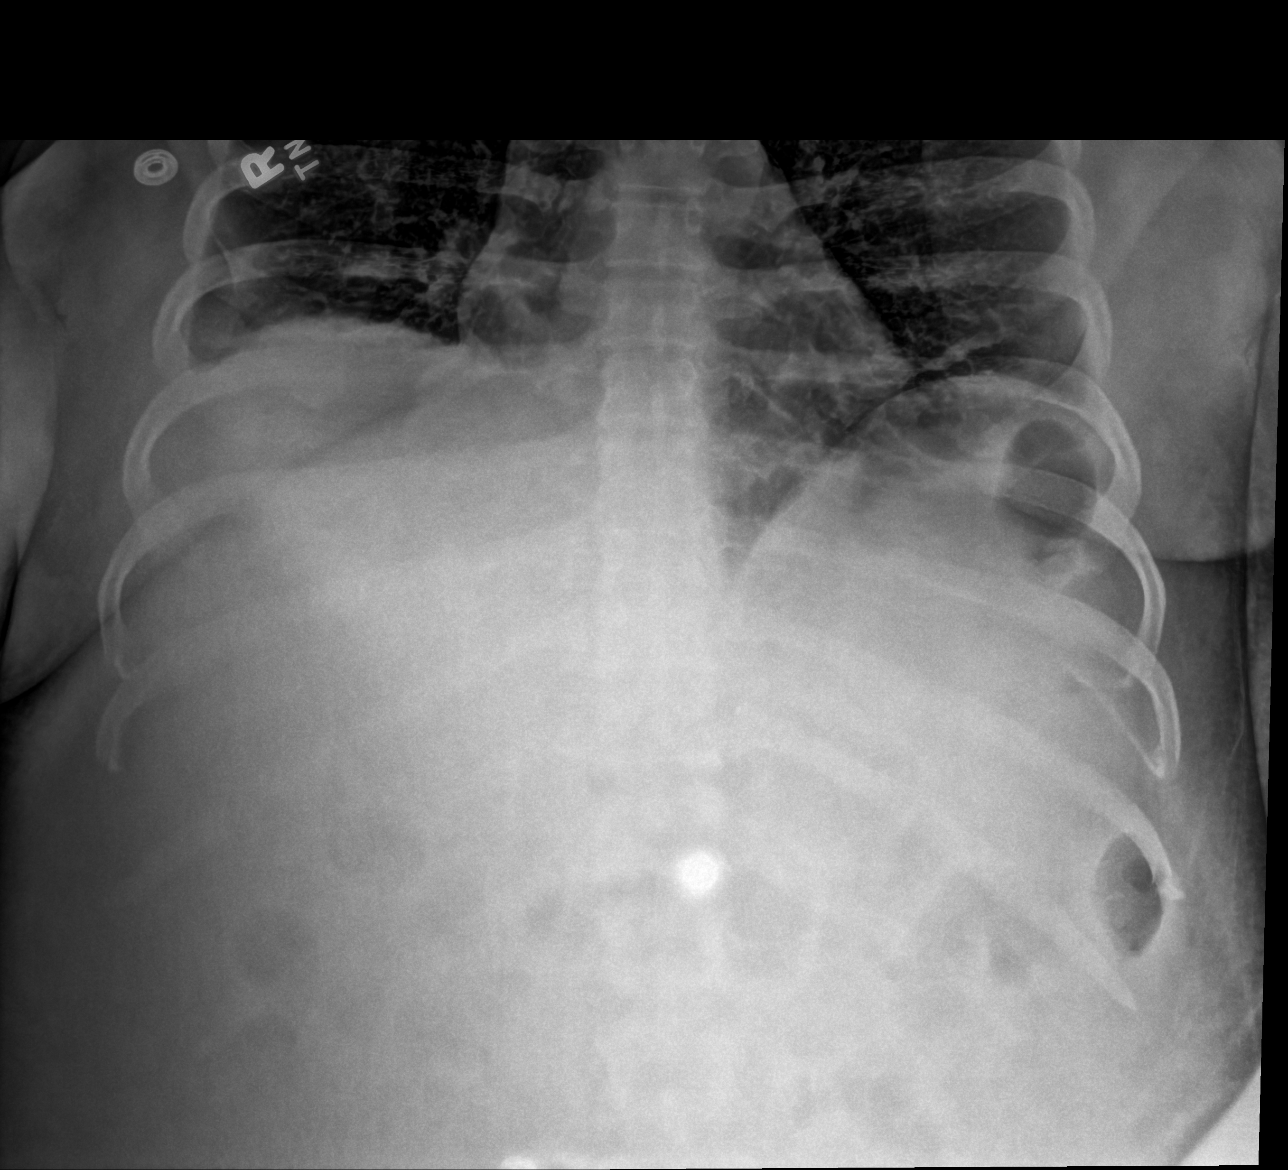

[abdomen supine (1 of 2)]
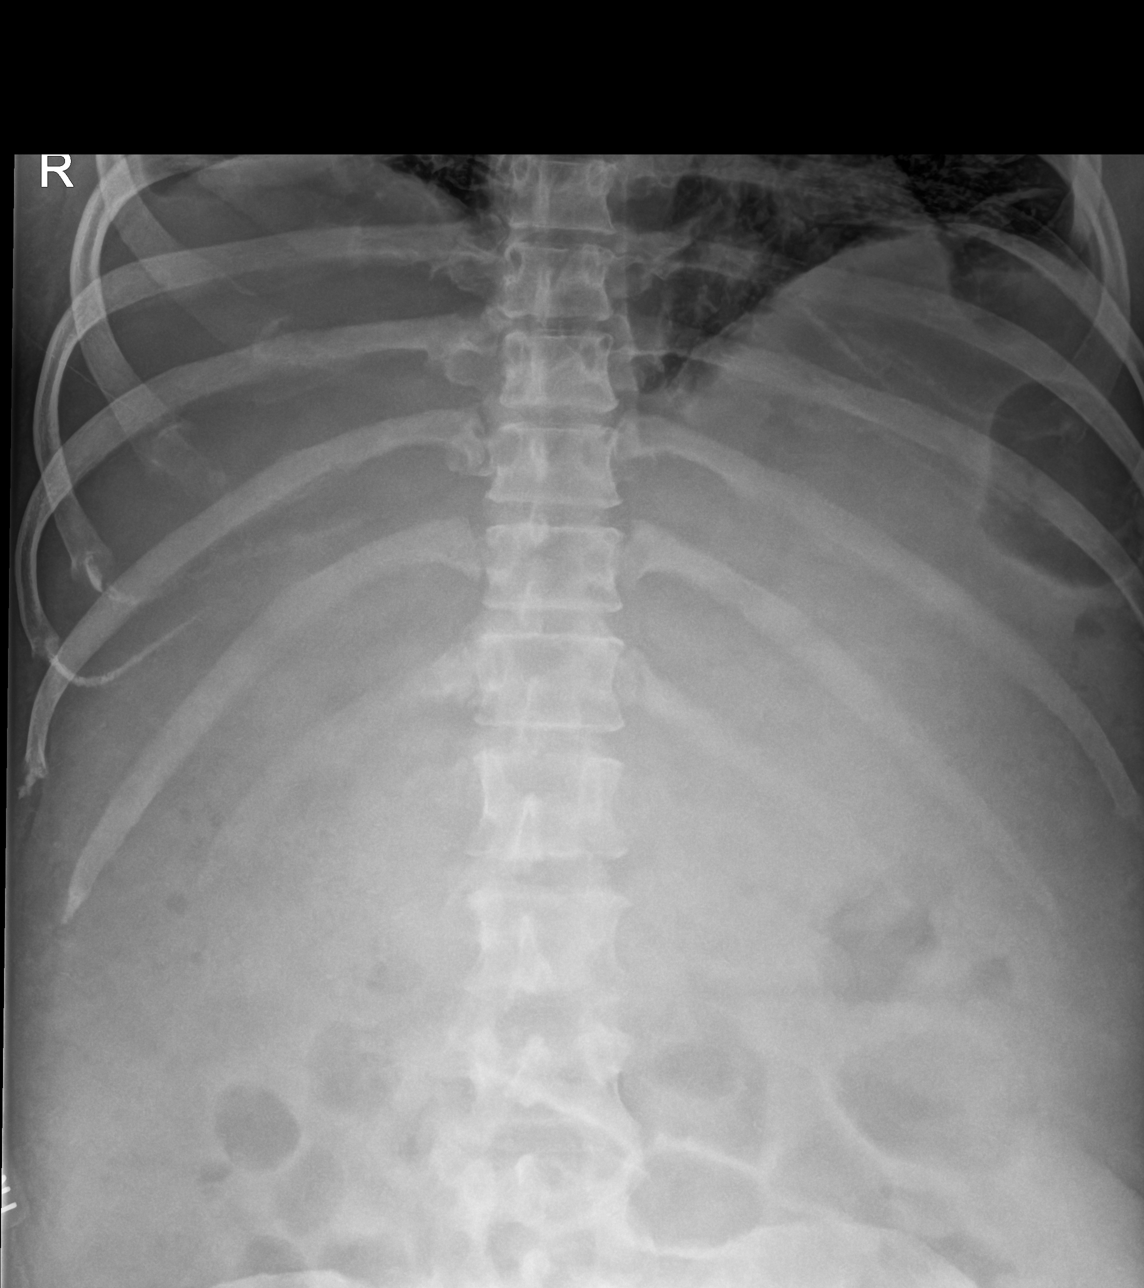

[abdomen supine (2 of 2)]
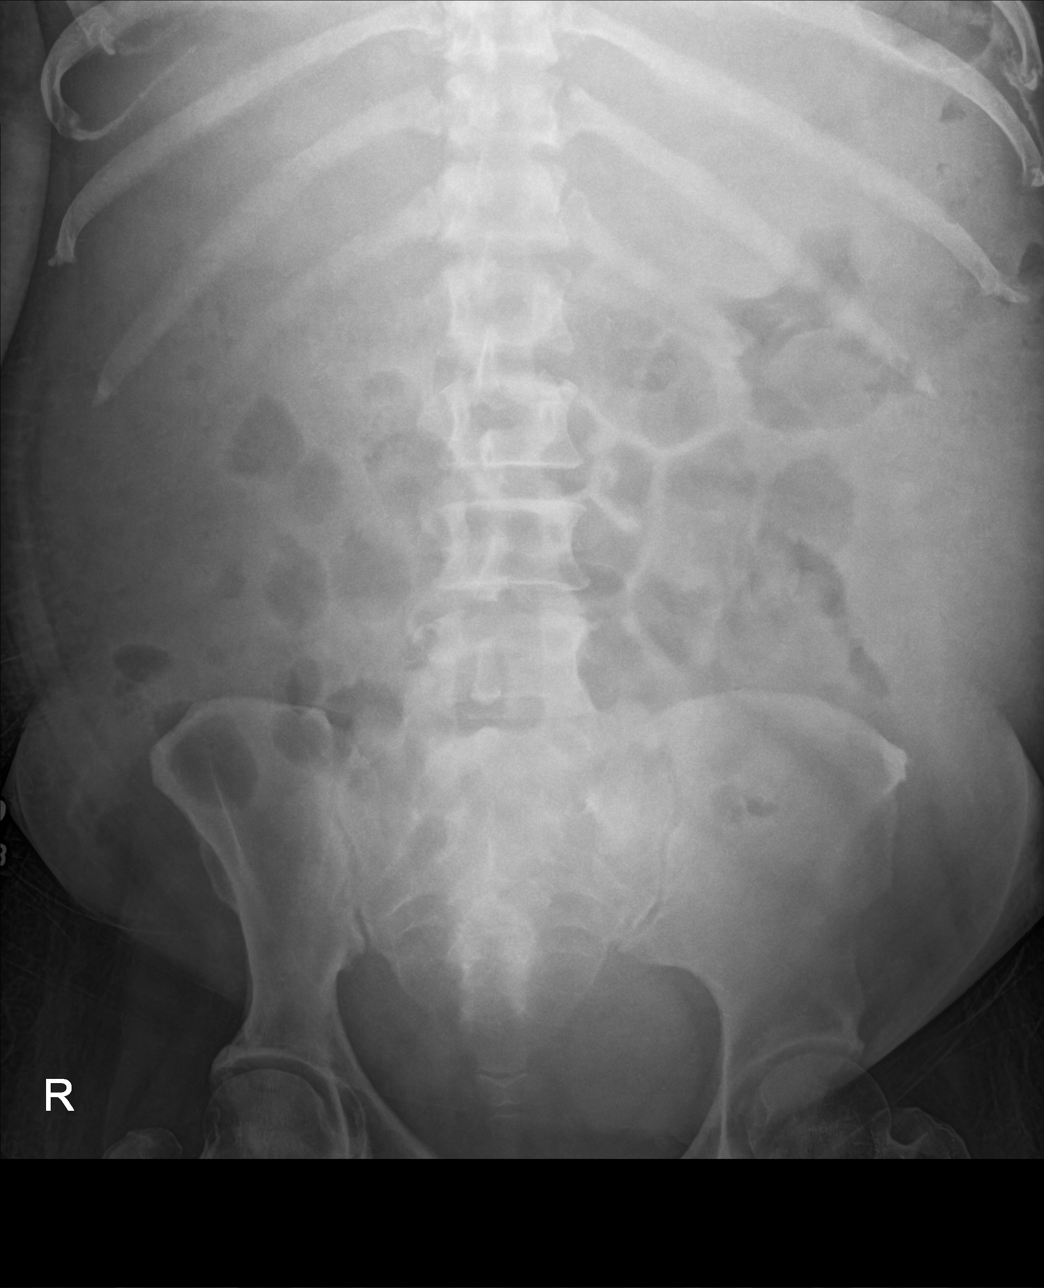

[4 of 4 positions shown; findings below may reference images not displayed]

FINDINGS: There is no evidence of dilated bowel loops or free intraperitoneal
air. No radiopaque calculi or other significant radiographic
abnormality is seen. Heart size and mediastinal contours are within
normal limits. Hypoinflation of the lungs is noted with mild
bibasilar subsegmental atelectasis with possible small right pleural
effusion.
IMPRESSION: Negative abdominal radiographs. Hypoinflation of the lungs is noted
with mild bibasilar subsegmental atelectasis with possible small
right pleural effusion.
